# Patient Record
Sex: Female | Born: 1970 | Race: Black or African American | Hispanic: No | Marital: Married | Smoking: Never smoker
Health system: Southern US, Community
[De-identification: ages and names within clinical notes are randomized; demographics above are authoritative.]

## PROBLEM LIST (undated history)

## (undated) DIAGNOSIS — K219 Gastro-esophageal reflux disease without esophagitis: Secondary | ICD-10-CM

## (undated) DIAGNOSIS — G8929 Other chronic pain: Secondary | ICD-10-CM

## (undated) HISTORY — PX: NO PAST SURGERIES: SHX2092

## (undated) HISTORY — DX: Other chronic pain: G89.29

---

## 2020-10-30 ENCOUNTER — Other Ambulatory Visit: Payer: Self-pay

## 2020-10-30 ENCOUNTER — Ambulatory Visit (HOSPITAL_COMMUNITY)
Admission: EM | Admit: 2020-10-30 | Discharge: 2020-10-30 | Disposition: A | Discharge status: Home / Self Care | Payer: Medicaid Other

## 2020-10-30 ENCOUNTER — Encounter (HOSPITAL_COMMUNITY): Payer: Self-pay

## 2020-10-30 DIAGNOSIS — K2950 Unspecified chronic gastritis without bleeding: Secondary | ICD-10-CM

## 2020-10-30 NOTE — ED Provider Notes (Signed)
[facility]    CSN: [id number] Arrival date & time: 10/30/20  1115      History   Chief Complaint Chief Complaint  Patient presents with   Abdominal Pain    HPI Stacy Conner is a 50 y.o. female.   StatePatient states she immigrated to [place] from [place] 5 days ago.  Patient reports history of abdominal issues since 2004.  She has intermittent episodes of constipation and diarrhea.  When having episodes of diarrhea, she experiences bloating abdominal cramping at various locations throughout her abdomen.  Patient states that despite alternating episodes of constipation and diarrhea she also chronically has epigastric pain that is reproducible with deep palpation.  Patient states she is able to eat but has pain when eating.  Patient denies any recent weight loss.  Patient denies nausea, vomiting, blood in stool, mucus in stool, fever, aches, chills.  States she is not currently taking any medication for this, states she has been prescribed medication by provider in [place] however states it did not help, does not know what medication was called.   Abdominal Pain Associated symptoms: constipation and diarrhea   Associated symptoms: no nausea and no vomiting    History reviewed. No pertinent past medical history.  There are no problems to display for this patient.   History reviewed. No pertinent surgical history.  OB History   No obstetric history on file.      Home Medications    Prior to Admission medications   Not on File    Family History Family History  Family history unknown: Yes    Social History Social History   Tobacco Use   Smoking status: Never   Smokeless tobacco: Never  Vaping Use   Vaping Use: Never used  Substance Use Topics   Alcohol use: Never   Drug use: Never     Allergies   Patient has no known allergies.   Review of Systems Review of Systems  Gastrointestinal:  Positive for abdominal pain, constipation  and diarrhea. Negative for abdominal distention, anal bleeding, blood in stool, nausea, rectal pain and vomiting.    Physical Exam Triage Vital Signs ED Triage Vitals  Enc Vitals Group     BP 10/30/20 1246 135/82     Pulse Rate 10/30/20 1246 97     Resp 10/30/20 1246 18     Temp 10/30/20 1246 98.6 F (37 C)     Temp Source 10/30/20 1246 Oral     SpO2 10/30/20 1246 100 %     Weight --      Height --      Head Circumference --      Peak Flow --      Pain Score 10/30/20 1240 5     Pain Loc --      Pain Edu? --      Excl. in GC? --    No data found.  Updated Vital Signs BP 135/82 (BP Location: Left Arm)   Pulse 97   Temp 98.6 F (37 C) (Oral)   Resp 18   LMP 10/25/2020 (Exact Date)   SpO2 100%   Visual Acuity Right Eye Distance:   Left Eye Distance:   Bilateral Distance:    Right Eye Near:   Left Eye Near:    Bilateral Near:     Physical Exam Constitutional:      Appearance: She is well-developed.  HENT:     Head: Normocephalic and atraumatic.  Cardiovascular:  Rate and Rhythm: Normal rate and regular rhythm.     Heart sounds: Normal heart sounds.  Pulmonary:     Effort: Pulmonary effort is normal.     Breath sounds: Normal breath sounds.  Abdominal:     General: Abdomen is flat. Bowel sounds are normal. There is no distension or abdominal bruit. There are no signs of injury.     Palpations: Abdomen is soft. There is no hepatomegaly, splenomegaly or mass.     Tenderness: There is abdominal tenderness in the epigastric area. There is no right CVA tenderness, left CVA tenderness, guarding or rebound.     Hernia: No hernia is present.  Skin:    General: Skin is warm and dry.  Neurological:     General: No focal deficit present.     Mental Status: She is alert and oriented to person, place, and time.  Psychiatric:        Mood and Affect: Mood normal.        Behavior: Behavior normal.     UC Treatments / Results  Labs (all labs ordered are listed, but  only abnormal results are displayed) Labs Reviewed - No data to display  EKG   Radiology No results found.  Procedures Procedures (including critical care time)  Medications Ordered in UC Medications - No data to display  Initial Impression / Assessment and Plan / UC Course  I have reviewed the triage vital signs and the nursing notes.  Pertinent labs & imaging results that were available during my care of the patient were reviewed by me and considered in my medical decision making (see chart for details).     Differential diagnoses include irritable bowel syndrome and gastritis, possible H. pylori infection given nationality and duration of symptoms.  At this time, patient states she is not having any pain and is nontoxic in appearance.  I am disinclined to prescribe a PPI for patient at this time as this will interfere with H. pylori breath testing.  I have strongly encourage patient to follow-up with primary care to discuss her symptoms and to request testing for H. pylori.  Plan discussed with patient who verbalized understanding.  All questions were addressed. Final Clinical Impressions(s) / UC Diagnoses   Final diagnoses:  Chronic gastritis without bleeding, unspecified gastritis type     Discharge Instructions      Because you are not in any acute pain during your visit today, I recommend that you obtain a primary care physician and follow-up with them regarding her constellation of abdominal symptoms.  I have sent a message that you require assistance primary care physician.  Someone should reach out to you within the next 2 to 3 days.     ED Prescriptions   None    PDMP not reviewed this encounter.   Theadora Rama Scales, PA-C 10/30/20 1351

## 2020-10-30 NOTE — Discharge Instructions (Addendum)
Because you are not in any acute pain during your visit today, I recommend that you obtain a primary care physician and follow-up with them regarding her constellation of abdominal symptoms.  I have sent a message that you require assistance primary care physician.  Someone should reach out to you within the next 2 to 3 days.

## 2020-10-30 NOTE — ED Triage Notes (Signed)
Pt in with c/ abdominal pain x 6 months  Pt has has not taken any medication for sxs

## 2020-11-29 ENCOUNTER — Telehealth: Payer: Self-pay

## 2020-11-29 NOTE — Telephone Encounter (Signed)
Patient is requesting transportation assistance. Ride scheduled with [employer] Pick Up time 1:00 PM.  Arman Bogus RN BSn PCCN  [facility] [phone]-cell [phone]-office

## 2020-11-30 ENCOUNTER — Other Ambulatory Visit: Payer: Self-pay

## 2020-11-30 ENCOUNTER — Encounter: Payer: Self-pay | Admitting: Nurse Practitioner

## 2020-11-30 ENCOUNTER — Ambulatory Visit (INDEPENDENT_AMBULATORY_CARE_PROVIDER_SITE_OTHER): Payer: Self-pay | Admitting: Nurse Practitioner

## 2020-11-30 VITALS — BP 126/77 | HR 93 | Temp 97.6°F | Ht 66.0 in | Wt 159.6 lb

## 2020-11-30 DIAGNOSIS — Z0000 Encounter for general adult medical examination without abnormal findings: Secondary | ICD-10-CM

## 2020-11-30 DIAGNOSIS — Z87898 Personal history of other specified conditions: Secondary | ICD-10-CM

## 2020-11-30 DIAGNOSIS — R002 Palpitations: Secondary | ICD-10-CM

## 2020-11-30 NOTE — Progress Notes (Signed)
[place], [place]  [address] Phone:  [phone]   Fax:  [phone] Subjective:   Patient ID: Stacy Conner, female    DOB: 11/25/1970, 50 y.o.   MRN: [id number]  Chief Complaint  Patient presents with   Establish Care    Pt is here to establish care. Pt has been having back pains x 2 years.  Pt states that she has been having respiratory issues due to the weather changing.  Pt states that she has been having racing heart beats x 1 month.     HPI [name] 50 y.o. female with no significant medical history to the [facility] to establish care. Patient a refugee from [place] and has been in the [place] for 1 mth. Currently resides with husband and kids. Does not currently adhere to any diet and/ exercise regimen. Not currently employed.  Requests evaluation for palpitations and shortness of breath that has been occurring intermittently for the past month. Denies any worsening or improving factors. Has not been previously evaluated for symptoms and has not taken any medications for symptoms. Symptoms occur periodically throughout the day.  Endorses a history of intermittent chest pain and shortness of breath. Chest pain located in the left anterior chest. Has had symptoms x 2 yrs. Was evaluated by doctor prior to arrival to the [place] and was informed it was related to changes in weather. Describes pain as sharp . Endorses some dizziness that occurs with symptoms.Denies any worsening or improving factors. Has not taken any medications for symptoms. When she was previously evaluated, had an ekg and ultrasound completed with normal results. Concerned because she continues to have pain. Currently unable to rate pain, describes as mild. Denies any possible anxiety or new stressors that may be contributing to symptoms  Verbalizes also having some GI concerns that are chronic in nature, 6 mths. Denies any other complaints during visit.  Denies any fever. Denies any fatigue or HA. Denies any blurred vision, numbness or tingling.    History reviewed. No pertinent past medical history.  History reviewed. No pertinent surgical history.  Family History  Family history unknown: Yes    Social History   Socioeconomic History   Marital status: Married    Spouse name: Not on file   Number of children: Not on file   Years of education: Not on file   Highest education level: Not on file  Occupational History   Not on file  Tobacco Use   Smoking status: Never   Smokeless tobacco: Never  Vaping Use   Vaping Use: Never used  Substance and Sexual Activity   Alcohol use: Never   Drug use: Never   Sexual activity: Yes    Birth control/protection: None  Other Topics Concern   Not on file  Social History Narrative   Not on file   Social Determinants of Health   Financial Resource Strain: Not on file  Food Insecurity: Not on file  Transportation Needs: Not on file  Physical Activity: Not on file  Stress: Not on file  Social Connections: Not on file  Intimate Partner Violence: Not on file    No outpatient medications prior to visit.   No facility-administered medications prior to visit.    No Known Allergies  Review of Systems  Constitutional:  Negative for chills, fever and malaise/fatigue.  HENT: Negative.    Eyes: Negative.   Respiratory:  Positive for shortness of breath. Negative  for cough.   Cardiovascular:  Positive for chest pain and palpitations. Negative for leg swelling.  Gastrointestinal:  Negative for abdominal pain, blood in stool, constipation, diarrhea, nausea and vomiting.  Genitourinary: Negative.   Musculoskeletal: Negative.   Skin: Negative.   Neurological:  Positive for dizziness. Negative for tingling, tremors, sensory change, speech change, focal weakness, loss of consciousness and headaches.  Psychiatric/Behavioral:  Negative for depression. The patient is not nervous/anxious.    All other systems reviewed and are negative.     Objective:    Physical Exam Vitals reviewed.  Constitutional:      General: She is not in acute distress.    Appearance: Normal appearance.  HENT:     Head: Normocephalic.     Right Ear: Tympanic membrane, ear canal and external ear normal.     Left Ear: Tympanic membrane, ear canal and external ear normal.     Nose: Nose normal.     Mouth/Throat:     Mouth: Mucous membranes are moist.     Pharynx: Oropharynx is clear.  Eyes:     Extraocular Movements: Extraocular movements intact.     Conjunctiva/sclera: Conjunctivae normal.     Pupils: Pupils are equal, round, and reactive to light.  Cardiovascular:     Rate and Rhythm: Normal rate and regular rhythm.     Pulses: Normal pulses.     Heart sounds: Normal heart sounds.     Comments: No obvious peripheral edema. Diffuse chest is non tender to palpation Pulmonary:     Effort: Pulmonary effort is normal.     Breath sounds: Normal breath sounds.  Abdominal:     General: Abdomen is flat. Bowel sounds are normal.     Palpations: Abdomen is soft.     Tenderness: There is no abdominal tenderness. There is no right CVA tenderness or left CVA tenderness.  Musculoskeletal:        General: Normal range of motion.     Cervical back: Normal range of motion and neck supple.  Skin:    General: Skin is warm and dry.     Capillary Refill: Capillary refill takes less than 2 seconds.  Neurological:     Mental Status: She is alert.  Psychiatric:        Mood and Affect: Mood normal.        Behavior: Behavior normal.        Thought Content: Thought content normal.        Judgment: Judgment normal.    BP 126/77   Pulse 93   Temp 97.6 F (36.4 C)   Ht 5' 6"  (1.676 m)   Wt 159 lb 9.6 oz (72.4 kg)   SpO2 100%   BMI 25.76 kg/m  Wt Readings from Last 3 Encounters:  11/30/20 159 lb 9.6 oz (72.4 kg)     There is no immunization history on file for this patient.  Diabetic Foot Exam -  Simple   No data filed     No results found for: TSH No results found for: WBC, HGB, HCT, MCV, PLT No results found for: NA, K, CHLORIDE, CO2, GLUCOSE, BUN, CREATININE, BILITOT, ALKPHOS, AST, ALT, PROT, ALBUMIN, CALCIUM, ANIONGAP, EGFR, GFR No results found for: CHOL No results found for: HDL No results found for: LDLCALC No results found for: TRIG No results found for: CHOLHDL No results found for: HGBA1C     Assessment & Plan:   Problem List Items Addressed This Visit   None Visit Diagnoses  Healthcare maintenance    -  Primary   Relevant Orders   CBC with Differential/Platelet   Comprehensive metabolic panel   Lipid panel   POCT glycosylated hemoglobin (Hb A1C)   Vitamin D, 25-hydroxy   Vitamin B12   HIV antibody (with reflex) Encouraged continued diet and exercise efforts  Encouraged continued compliance with medication     Palpitations       Relevant Orders   CBC with Differential/Platelet   Comprehensive metabolic panel   Lipid panel   POCT glycosylated hemoglobin (Hb A1C)   Vitamin D, 25-hydroxy   Vitamin B12   EKG 12-Lead: EKG: normal EKG, normal sinus rhythm.    History of chest pain     Patient informed to take OTC medications as needed for symptoms Will reevaluate need for prescription medications after review of completed studies. Patient may also require referral to cardiology.    Follow up in 2 wks for further evaluation of GI symptoms, sooner as needed    Brett Albino does not currently have medications on file.  No orders of the defined types were placed in this encounter.    Teena Dunk, NP

## 2020-11-30 NOTE — Patient Instructions (Signed)
You were seen today in the [facility] establish care. Labs were collected, results will be available via MyChart or, if abnormal, you will be contacted by clinic staff.  Please follow up in 2 wks for evaluation of GI symptoms.

## 2020-12-01 LAB — CBC WITH DIFFERENTIAL/PLATELET
Basophils Absolute: 0 10*3/uL (ref 0.0–0.2)
Basos: 1 %
EOS (ABSOLUTE): 0.7 10*3/uL — ABNORMAL HIGH (ref 0.0–0.4)
Eos: 11 %
Hematocrit: 39.1 % (ref 34.0–46.6)
Hemoglobin: 13.1 g/dL (ref 11.1–15.9)
Immature Grans (Abs): 0 10*3/uL (ref 0.0–0.1)
Immature Granulocytes: 0 %
Lymphocytes Absolute: 2.5 10*3/uL (ref 0.7–3.1)
Lymphs: 40 %
MCH: 29.4 pg (ref 26.6–33.0)
MCHC: 33.5 g/dL (ref 31.5–35.7)
MCV: 88 fL (ref 79–97)
Monocytes Absolute: 0.7 10*3/uL (ref 0.1–0.9)
Monocytes: 11 %
Neutrophils Absolute: 2.3 10*3/uL (ref 1.4–7.0)
Neutrophils: 37 %
Platelets: 285 10*3/uL (ref 150–450)
RBC: 4.46 x10E6/uL (ref 3.77–5.28)
RDW: 12.1 % (ref 11.7–15.4)
WBC: 6.3 10*3/uL (ref 3.4–10.8)

## 2020-12-01 LAB — VITAMIN B12: Vitamin B-12: 798 pg/mL (ref 232–1245)

## 2020-12-01 LAB — COMPREHENSIVE METABOLIC PANEL
ALT: 12 IU/L (ref 0–32)
AST: 15 IU/L (ref 0–40)
Albumin/Globulin Ratio: 1.4 (ref 1.2–2.2)
Albumin: 4.3 g/dL (ref 3.8–4.8)
Alkaline Phosphatase: 71 IU/L (ref 44–121)
BUN/Creatinine Ratio: 22 (ref 9–23)
BUN: 16 mg/dL (ref 6–24)
Bilirubin Total: 0.3 mg/dL (ref 0.0–1.2)
CO2: 24 mmol/L (ref 20–29)
Calcium: 9.3 mg/dL (ref 8.7–10.2)
Chloride: 100 mmol/L (ref 96–106)
Creatinine, Ser: 0.74 mg/dL (ref 0.57–1.00)
Globulin, Total: 3.1 g/dL (ref 1.5–4.5)
Glucose: 93 mg/dL (ref 70–99)
Potassium: 3.9 mmol/L (ref 3.5–5.2)
Sodium: 138 mmol/L (ref 134–144)
Total Protein: 7.4 g/dL (ref 6.0–8.5)
eGFR: 100 mL/min/{1.73_m2} (ref >59)

## 2020-12-01 LAB — LIPID PANEL
Chol/HDL Ratio: 3.3 ratio (ref 0.0–4.4)
Cholesterol, Total: 190 mg/dL (ref 100–199)
HDL: 58 mg/dL (ref >39)
LDL Chol Calc (NIH): 121 mg/dL — ABNORMAL HIGH (ref 0–99)
Triglycerides: 56 mg/dL (ref 0–149)
VLDL Cholesterol Cal: 11 mg/dL (ref 5–40)

## 2020-12-01 LAB — VITAMIN D 25 HYDROXY (VIT D DEFICIENCY, FRACTURES): Vit D, 25-Hydroxy: 13.3 ng/mL — ABNORMAL LOW (ref 30.0–100.0)

## 2020-12-01 LAB — HIV ANTIBODY (ROUTINE TESTING W REFLEX): HIV Screen 4th Generation wRfx: NONREACTIVE

## 2020-12-03 ENCOUNTER — Other Ambulatory Visit: Payer: Self-pay | Admitting: Nurse Practitioner

## 2020-12-03 DIAGNOSIS — E559 Vitamin D deficiency, unspecified: Secondary | ICD-10-CM

## 2020-12-03 MED ORDER — VITAMIN D (ERGOCALCIFEROL) 50000 UNITS PO CAPS: 1.0000 | ORAL_CAPSULE | ORAL | 0 refills | Status: DC

## 2020-12-11 ENCOUNTER — Ambulatory Visit: Payer: Self-pay | Admitting: Nurse Practitioner

## 2020-12-13 ENCOUNTER — Telehealth: Payer: Self-pay

## 2020-12-13 NOTE — Telephone Encounter (Signed)
Transportation scheduled for October 31st. Pick up time 1:45pm Patient made aware.  Nicole Cella Trent Theisen RN BSn PCCN  [facility] [phone]-cell [phone]-office

## 2020-12-17 ENCOUNTER — Ambulatory Visit: Payer: Medicaid Other | Admitting: Nurse Practitioner

## 2020-12-21 ENCOUNTER — Encounter: Payer: Self-pay | Admitting: Nurse Practitioner

## 2020-12-21 ENCOUNTER — Other Ambulatory Visit: Payer: Self-pay

## 2020-12-21 ENCOUNTER — Ambulatory Visit (INDEPENDENT_AMBULATORY_CARE_PROVIDER_SITE_OTHER): Payer: Medicaid Other | Admitting: Nurse Practitioner

## 2020-12-21 VITALS — BP 114/85 | HR 83 | Temp 98.1°F | Ht 66.0 in | Wt 156.0 lb

## 2020-12-21 DIAGNOSIS — R1084 Generalized abdominal pain: Secondary | ICD-10-CM | POA: Diagnosis not present

## 2020-12-21 DIAGNOSIS — E559 Vitamin D deficiency, unspecified: Secondary | ICD-10-CM | POA: Diagnosis not present

## 2020-12-21 DIAGNOSIS — G4489 Other headache syndrome: Secondary | ICD-10-CM | POA: Diagnosis not present

## 2020-12-21 DIAGNOSIS — Z0000 Encounter for general adult medical examination without abnormal findings: Secondary | ICD-10-CM

## 2020-12-21 LAB — POCT GLYCOSYLATED HEMOGLOBIN (HGB A1C): Hemoglobin A1C: 5.5 % (ref 4.0–5.6)

## 2020-12-21 MED ORDER — VITAMIN D (ERGOCALCIFEROL) 50000 UNITS PO CAPS: 1.0000 | ORAL_CAPSULE | ORAL | 0 refills | Status: DC

## 2020-12-21 MED ORDER — BUTALBITAL-APAP-CAFFEINE 50-325-40 MG PO TABS: 1.0000 | ORAL_TABLET | Freq: Four times a day (QID) | ORAL | 0 refills | Status: DC | PRN

## 2020-12-21 MED ORDER — ONDANSETRON HCL 4 MG PO TABS: 4.0000 mg | ORAL_TABLET | Freq: Three times a day (TID) | ORAL | 0 refills | Status: DC | PRN

## 2020-12-21 MED ORDER — DICYCLOMINE HCL 10 MG PO CAPS: 10.0000 mg | ORAL_CAPSULE | Freq: Three times a day (TID) | ORAL | 0 refills | Status: DC | PRN

## 2020-12-21 NOTE — Patient Instructions (Signed)
You were seen today in the [facility] for abdominal pain and headache. Labs were collected, results will be available via MyChart or, if abnormal, you will be contacted by clinic staff. You were prescribed medications, please take as directed. Please follow up in 1-2 wks for reevaluation, we will call with your follow up appointment after review of completed studies.

## 2020-12-21 NOTE — Progress Notes (Signed)
[place], [place]  [address] Phone:  [phone]   Fax:  [phone] Subjective:   Patient ID: Stacy Conner, female    DOB: 03/28/1970, 50 y.o.   MRN: [id number]  Chief Complaint  Patient presents with   Follow-up    2 week follow up; continues to have stomach ache. Unable to eat or sleep, causing headaches.    HPI Stacy Conner 50 y.o. female with no significant medical history to the [facility] for further evaluation of abdominal pain. Patient states that she has had abdominal pain since 2000, that worsened since transitioning to the [place]. Pain located in the mid upper abdomen. Has never been evaluated and/ treated for pain. Denies any known worsening or improving factors. Has taken pain medications for pain with no relief. Endorses nausea with vomiting, especially after eating certain foods, such as foods that taste sour. Able to eat soup without any symptoms. Verbalizes having bloating and diarrhea. When she vomits, it is bitter tasting and yellow in color.Currently rates pain 10/10 and describes as stabbing. Last bowel movement  Also concerned about HA x 3 days. HA located in the frontal portion of head and bilateral temples. Has had some blurry vision and ringing in ears with headaches. Has not taken any medications for symptoms. Had similar HA 2 wks ago that resolved without intervention after 3 days. Currently rates pain 10/10 and describes as throbbing.   States that with abdominal pain and HA has been unable to sleep for several days. Denies any other complaints today. Denies any fever. Denies any fatigue, chest pain, shortness of breath or dizziness. Denies any numbness or tingling.   No past medical history on file.  No past surgical history on file.  Family History  Family history unknown: Yes    Social History   Socioeconomic History   Marital status: Married    Spouse name: Not on file   Number of  children: Not on file   Years of education: Not on file   Highest education level: Not on file  Occupational History   Not on file  Tobacco Use   Smoking status: Never   Smokeless tobacco: Never  Vaping Use   Vaping Use: Never used  Substance and Sexual Activity   Alcohol use: Never   Drug use: Never   Sexual activity: Yes    Birth control/protection: None  Other Topics Concern   Not on file  Social History Narrative   Not on file   Social Determinants of Health   Financial Resource Strain: Not on file  Food Insecurity: Not on file  Transportation Needs: Not on file  Physical Activity: Not on file  Stress: Not on file  Social Connections: Not on file  Intimate Partner Violence: Not on file    Outpatient Medications Prior to Visit  Medication Sig Dispense Refill   Vitamin D, Ergocalciferol, 50000 units CAPS Take 1 tablet by mouth once a week for 8 doses. (Patient not taking: Reported on 12/21/2020) 8 capsule 0   No facility-administered medications prior to visit.    No Known Allergies  Review of Systems  Constitutional:  Negative for chills, fever and malaise/fatigue.  HENT: Negative.    Eyes:  Positive for blurred vision.  Respiratory:  Negative for cough and shortness of breath.   Cardiovascular:  Negative for chest pain, palpitations and leg swelling.  Gastrointestinal:  Positive for abdominal pain, diarrhea, nausea and vomiting. Negative  for blood in stool and constipation.  Genitourinary: Negative.   Musculoskeletal: Negative.   Skin: Negative.   Neurological:  Positive for headaches. Negative for dizziness, tingling, tremors, seizures, loss of consciousness and weakness.  Psychiatric/Behavioral:  Negative for depression. The patient is not nervous/anxious.   All other systems reviewed and are negative.     Objective:    Physical Exam Vitals reviewed.  Constitutional:      General: She is not in acute distress.    Appearance: Normal appearance.  HENT:      Head: Normocephalic.     Right Ear: Tympanic membrane, ear canal and external ear normal.     Left Ear: Tympanic membrane and external ear normal.     Nose: Nose normal.     Mouth/Throat:     Mouth: Mucous membranes are moist.     Pharynx: Oropharynx is clear.  Eyes:     Extraocular Movements: Extraocular movements intact.     Conjunctiva/sclera: Conjunctivae normal.     Pupils: Pupils are equal, round, and reactive to light.  Cardiovascular:     Rate and Rhythm: Normal rate and regular rhythm.     Pulses: Normal pulses.     Heart sounds: Normal heart sounds.     Comments: No obvious peripheral edema Pulmonary:     Effort: Pulmonary effort is normal.     Breath sounds: Normal breath sounds.  Abdominal:     General: Abdomen is flat. Bowel sounds are normal. There is no distension.     Palpations: Abdomen is soft. There is no mass.     Tenderness: There is no abdominal tenderness. There is no right CVA tenderness, left CVA tenderness or rebound.  Musculoskeletal:     Cervical back: Normal range of motion and neck supple.  Skin:    General: Skin is warm and dry.     Capillary Refill: Capillary refill takes less than 2 seconds.  Neurological:     General: No focal deficit present.     Mental Status: She is alert and oriented to person, place, and time.  Psychiatric:        Mood and Affect: Mood normal.        Behavior: Behavior normal.        Thought Content: Thought content normal.        Judgment: Judgment normal.    BP 114/85 (BP Location: Right Arm, Patient Position: Sitting)   Pulse 83   Temp 98.1 F (36.7 C)   Wt 156 lb 0.7 oz (70.8 kg)   LMP 12/14/2020   SpO2 100%   BMI 25.19 kg/m  Wt Readings from Last 3 Encounters:  12/21/20 156 lb 0.7 oz (70.8 kg)  11/30/20 159 lb 9.6 oz (72.4 kg)     There is no immunization history on file for this patient.  Diabetic Foot Exam - Simple   No data filed     No results found for: TSH Lab Results  Component Value  Date   WBC 6.3 11/30/2020   HGB 13.1 11/30/2020   HCT 39.1 11/30/2020   MCV 88 11/30/2020   PLT 285 11/30/2020   Lab Results  Component Value Date   NA 138 11/30/2020   K 3.9 11/30/2020   CO2 24 11/30/2020   GLUCOSE 93 11/30/2020   BUN 16 11/30/2020   CREATININE 0.74 11/30/2020   BILITOT 0.3 11/30/2020   ALKPHOS 71 11/30/2020   AST 15 11/30/2020   ALT 12 11/30/2020   PROT 7.4 11/30/2020  ALBUMIN 4.3 11/30/2020   CALCIUM 9.3 11/30/2020   EGFR 100 11/30/2020   Lab Results  Component Value Date   CHOL 190 11/30/2020   Lab Results  Component Value Date   HDL 58 11/30/2020   Lab Results  Component Value Date   LDLCALC 121 (H) 11/30/2020   Lab Results  Component Value Date   TRIG 56 11/30/2020   Lab Results  Component Value Date   CHOLHDL 3.3 11/30/2020   No results found for: HGBA1C     Assessment & Plan:  Given patients history and physical, concerned for gastroenteritis v enteritis v gastritis v diverticulitis v cholelithiasis v cholecystitis v GERD   With regard to HA concerned for new onset migraines   Orders completed as below:  Problem List Items Addressed This Visit   None Visit Diagnoses     Generalized abdominal pain    -  Primary   Relevant Medications   dicyclomine (BENTYL) 10 MG capsule   ondansetron (ZOFRAN) 4 MG tablet Discussed non pharmacological methods for managing pain   Other Relevant Orders   Lipase   POCT glycosylated hemoglobin (Hb A1C)   CT Abdomen Pelvis W Contrast   Healthcare maintenance       Relevant Orders   POCT glycosylated hemoglobin (Hb A1C)   Other headache syndrome       Relevant Medications   butalbital-acetaminophen-caffeine (FIORICET) 50-325-40 MG tablet Discussed non pharmacological methods for managing pain   Vitamin D deficiency       Relevant Medications   Vitamin D, Ergocalciferol, 50000 units CAPS   Will notify patient of follow up after reviewing studies, may require referral to GI with subsequent  colonoscopy    Stacy Conner start on butalbital-acetaminophen-caffeine, dicyclomine, and ondansetron. Stacy am also having her maintain her Vitamin D (Ergocalciferol).  Meds ordered this encounter  Medications   butalbital-acetaminophen-caffeine (FIORICET) 50-325-40 MG tablet    Sig: Take 1 tablet by mouth every 6 (six) hours as needed for headache.    Dispense:  14 tablet    Refill:  0   dicyclomine (BENTYL) 10 MG capsule    Sig: Take 1 capsule (10 mg total) by mouth 3 (three) times daily as needed (abdominal pain).    Dispense:  20 capsule    Refill:  0   ondansetron (ZOFRAN) 4 MG tablet    Sig: Take 1 tablet (4 mg total) by mouth every 8 (eight) hours as needed for nausea or vomiting.    Dispense:  20 tablet    Refill:  0   Vitamin D, Ergocalciferol, 50000 units CAPS    Sig: Take 1 tablet by mouth once a week for 8 doses.    Dispense:  8 capsule    Refill:  0     Teena Dunk, NP

## 2020-12-22 LAB — LIPASE: Lipase: 26 U/L (ref 14–72)

## 2021-01-15 ENCOUNTER — Other Ambulatory Visit: Payer: Self-pay

## 2021-01-15 ENCOUNTER — Ambulatory Visit (HOSPITAL_COMMUNITY)
Admission: EM | Admit: 2021-01-15 | Discharge: 2021-01-15 | Disposition: A | Discharge status: Home / Self Care | Payer: Medicaid Other | Attending: Emergency Medicine | Admitting: Emergency Medicine

## 2021-01-15 ENCOUNTER — Encounter (HOSPITAL_COMMUNITY): Payer: Self-pay

## 2021-01-15 DIAGNOSIS — R112 Nausea with vomiting, unspecified: Secondary | ICD-10-CM | POA: Diagnosis present

## 2021-01-15 DIAGNOSIS — K2950 Unspecified chronic gastritis without bleeding: Secondary | ICD-10-CM | POA: Diagnosis present

## 2021-01-15 DIAGNOSIS — R1084 Generalized abdominal pain: Secondary | ICD-10-CM | POA: Diagnosis present

## 2021-01-15 LAB — POCT URINALYSIS DIPSTICK, ED / UC
Bilirubin Urine: NEGATIVE
Glucose, UA: NEGATIVE mg/dL
Nitrite: NEGATIVE
Protein, ur: NEGATIVE mg/dL
Specific Gravity, Urine: 1.02 (ref 1.005–1.030)
Urobilinogen, UA: 0.2 mg/dL (ref 0.0–1.0)
pH: 6.5 (ref 5.0–8.0)

## 2021-01-15 MED ORDER — ONDANSETRON HCL 4 MG PO TABS: 4.0000 mg | ORAL_TABLET | Freq: Three times a day (TID) | ORAL | 0 refills | Status: DC | PRN

## 2021-01-15 MED ORDER — ONDANSETRON 4 MG PO TBDP
4.0000 mg | ORAL_TABLET | Freq: Once | ORAL | Status: AC
  Administered 2021-01-15: 4 mg via ORAL

## 2021-01-15 MED ORDER — PANTOPRAZOLE SODIUM 40 MG PO TBEC: 40.0000 mg | DELAYED_RELEASE_TABLET | Freq: Every day | ORAL | 0 refills | Status: DC

## 2021-01-15 MED ORDER — ALUM & MAG HYDROXIDE-SIMETH 200-200-20 MG/5ML PO SUSP
30.0000 mL | Freq: Once | ORAL | Status: AC
  Administered 2021-01-15: 30 mL via ORAL

## 2021-01-15 NOTE — ED Provider Notes (Signed)
[facility]    CSN: [id number] Arrival date & time: 01/15/21  1303      History   Chief Complaint Chief Complaint  Patient presents with   Abdominal Pain    HPI Stacy Conner is a 50 y.o. female. She reports multiple long-term abdominal complaints. Reports 3 days of "Burning" abdominal pain in B sides and epigastric area although entire abd hurts. Associated with vomiting every time she eats. Has not modified her diet since pain and vomiting began. Emesis is yellow and "bitter".  Reports similar symptoms previously when she was living in [place]; she was treated for "stomach bacteria" with antibiotics; she was told the infection could recur. She thinks she has the same infection again. Reports recent occasional dysuria. Also has a headache today. Has never had surgery.   Recently established care at the [facility] and was prescribed dicyclomine and ondanestron in early November but pt stopped taking them a week ago because she felt they were making her feel worse.   Also complains of "colon problems" and requests "stool testing".    This visit was conducted with the help of an arabic interpreter by telephone.    Abdominal Pain Associated symptoms: constipation, dysuria, nausea and vomiting   Associated symptoms: no chills, no diarrhea and no fever    History reviewed. No pertinent past medical history.  There are no problems to display for this patient.   History reviewed. No pertinent surgical history.  OB History   No obstetric history on file.      Home Medications    Prior to Admission medications   Medication Sig Start Date End Date Taking? Authorizing Provider  pantoprazole (PROTONIX) 40 MG tablet Take 1 tablet (40 mg total) by mouth daily. 01/15/21  Yes Cathlyn Parsons, NP  butalbital-acetaminophen-caffeine (FIORICET) 50-325-40 MG tablet Take 1 tablet by mouth every 6 (six) hours as needed for headache. 12/21/20    Passmore, Enid Derry I, NP  dicyclomine (BENTYL) 10 MG capsule Take 1 capsule (10 mg total) by mouth 3 (three) times daily as needed (abdominal pain). 12/21/20   Passmore, Enid Derry I, NP  ondansetron (ZOFRAN) 4 MG tablet Take 1 tablet (4 mg total) by mouth every 8 (eight) hours as needed for nausea or vomiting. 01/15/21   Cathlyn Parsons, NP  Vitamin D, Ergocalciferol, 50000 units CAPS Take 1 tablet by mouth once a week for 8 doses. 12/21/20 02/09/21  Orion Crook I, NP    Family History Family History  Family history unknown: Yes    Social History Social History   Tobacco Use   Smoking status: Never   Smokeless tobacco: Never  Vaping Use   Vaping Use: Never used  Substance Use Topics   Alcohol use: Never   Drug use: Never     Allergies   Patient has no known allergies.   Review of Systems Review of Systems  Constitutional:  Negative for appetite change, chills and fever.  Gastrointestinal:  Positive for abdominal pain, constipation, nausea and vomiting. Negative for diarrhea.  Genitourinary:  Positive for dysuria.    Physical Exam Triage Vital Signs ED Triage Vitals  Enc Vitals Group     BP 01/15/21 1425 128/81     Pulse Rate 01/15/21 1425 93     Resp 01/15/21 1425 18     Temp 01/15/21 1425 98.6 F (37 C)     Temp Source 01/15/21 1425 Oral     SpO2 01/15/21 1425 100 %  Weight --      Height --      Head Circumference --      Peak Flow --      Pain Score 01/15/21 1431 8     Pain Loc --      Pain Edu? --      Excl. in GC? --    No data found.  Updated Vital Signs BP 128/81 (BP Location: Left Arm)   Pulse 93   Temp 98.6 F (37 C) (Oral)   Resp 18   LMP 01/10/2021 (Exact Date)   SpO2 100%   Visual Acuity Right Eye Distance:   Left Eye Distance:   Bilateral Distance:    Right Eye Near:   Left Eye Near:    Bilateral Near:     Physical Exam Constitutional:      Appearance: She is well-developed.     Comments: Appears uncomfortable   Cardiovascular:     Rate and Rhythm: Normal rate and regular rhythm.  Pulmonary:     Effort: Pulmonary effort is normal.     Breath sounds: Normal breath sounds.  Abdominal:     General: Abdomen is flat. Bowel sounds are normal. There is no distension.     Palpations: Abdomen is soft.     Tenderness: There is generalized abdominal tenderness. There is no right CVA tenderness, left CVA tenderness, guarding or rebound.     Comments: Pain is worst in epigastric area  Neurological:     Mental Status: She is alert.     UC Treatments / Results  Labs (all labs ordered are listed, but only abnormal results are displayed) Labs Reviewed  POCT URINALYSIS DIPSTICK, ED / UC - Abnormal; Notable for the following components:      Result Value   Ketones, ur TRACE (*)    Hgb urine dipstick SMALL (*)    Leukocytes,Ua TRACE (*)    All other components within normal limits  H. PYLORI ANTIBODY, IGG    EKG   Radiology No results found.  Procedures Procedures (including critical care time)  Medications Ordered in UC Medications  alum & mag hydroxide-simeth (MAALOX/MYLANTA) 200-200-20 MG/5ML suspension 30 mL (30 mLs Oral Given 01/15/21 1535)  ondansetron (ZOFRAN-ODT) disintegrating tablet 4 mg (4 mg Oral Given 01/15/21 1516)    Initial Impression / Assessment and Plan / UC Course  I have reviewed the triage vital signs and the nursing notes.  Pertinent labs & imaging results that were available during my care of the patient were reviewed by me and considered in my medical decision making (see chart for details).     Nausea improved with zofran ODT; abdominal pain improved with Maalox.  Pt to f/u with pcp for further eval of recurrent, chronic abdominal pain. Rx zofran prn and protonix to be taken daily. H.Pylori test obtained - I verified pt's phone number in epic is correct - she requires [place] Arabic interpreter.     Final Clinical Impressions(s) / UC Diagnoses   Final  diagnoses:  Chronic gastritis without bleeding, unspecified gastritis type  Nausea and vomiting, unspecified vomiting type   Discharge Instructions   None    ED Prescriptions     Medication Sig Dispense Auth. Provider   ondansetron (ZOFRAN) 4 MG tablet Take 1 tablet (4 mg total) by mouth every 8 (eight) hours as needed for nausea or vomiting. 20 tablet Cathlyn Parsons, NP   pantoprazole (PROTONIX) 40 MG tablet Take 1 tablet (40 mg total) by mouth daily.  30 tablet Cathlyn Parsons, NP      PDMP not reviewed this encounter.   Cathlyn Parsons, NP 01/15/21 [phone]

## 2021-01-15 NOTE — ED Triage Notes (Signed)
Pt presents with c/o abdominal pain x 3 days. States she has been feeling tired and burning sensation in her abdomen. States when she eats she feels nauseas and vomits yellow coloring.

## 2021-01-16 ENCOUNTER — Ambulatory Visit
Admission: RE | Admit: 2021-01-16 | Discharge: 2021-01-16 | Disposition: A | Discharge status: Home / Self Care | Payer: Self-pay | Source: Ambulatory Visit | Attending: Obstetrics and Gynecology | Admitting: Obstetrics and Gynecology

## 2021-01-16 ENCOUNTER — Other Ambulatory Visit: Payer: Self-pay | Admitting: Obstetrics and Gynecology

## 2021-01-16 DIAGNOSIS — R9389 Abnormal findings on diagnostic imaging of other specified body structures: Secondary | ICD-10-CM

## 2021-01-16 LAB — H. PYLORI ANTIBODY, IGG: H Pylori IgG: 0.79 Index Value (ref 0.00–0.79)

## 2021-02-12 ENCOUNTER — Other Ambulatory Visit: Payer: Self-pay | Admitting: Nurse Practitioner

## 2021-02-12 DIAGNOSIS — E559 Vitamin D deficiency, unspecified: Secondary | ICD-10-CM

## 2021-02-26 ENCOUNTER — Ambulatory Visit: Payer: Medicaid Other | Admitting: Nurse Practitioner

## 2021-02-26 ENCOUNTER — Other Ambulatory Visit: Payer: Self-pay

## 2021-02-27 ENCOUNTER — Ambulatory Visit (INDEPENDENT_AMBULATORY_CARE_PROVIDER_SITE_OTHER): Payer: Medicaid Other | Admitting: Nurse Practitioner

## 2021-02-27 DIAGNOSIS — H6061 Unspecified chronic otitis externa, right ear: Secondary | ICD-10-CM | POA: Diagnosis not present

## 2021-02-27 MED ORDER — NEOMYCIN-POLYMYXIN-HC 1 % OT SOLN: 3.0000 [drp] | Freq: Four times a day (QID) | OTIC | 0 refills | Status: AC

## 2021-02-27 NOTE — Patient Instructions (Signed)
You were seen today in the [facility] for right ear infection. You were prescribed medications, please take as directed. Please follow up as needed.

## 2021-02-27 NOTE — Progress Notes (Signed)
[place], [place]  [address] Phone:  [phone]   Fax:  [phone] Subjective:   Patient ID: Stacy Conner, female    DOB: [birth year], 51 y.o.   MRN: [id number]  Chief Complaint  Patient presents with   Ear Pain    Has been going on for 2 to 3 months. Pain in head and eye. Patient also stated that she has not been to sleep for 3 days.   HPI Stacy Conner 51 y.o. female  has no past medical history on file. To the [facility] for right ear pain.  Patient states that she has had right ear pain and itching x 2-3 months, since going to [place]. Was treated for infection while in [place], but was unable to afford medication. Endorses intermittent changes in hearing, with some tinnitus. Currently rates pain 10/10 and describes as throbbing and sharp. Denies any drainage from ear. Denies any fever. Denies any other complaints today.  Denies any fatigue, chest pain, shortness of breath, HA or dizziness. Denies any blurred vision, numbness or tingling.   No past medical history on file.  No past surgical history on file.  Family History  Family history unknown: Yes    Social History   Socioeconomic History   Marital status: Married    Spouse name: Not on file   Number of children: Not on file   Years of education: Not on file   Highest education level: Not on file  Occupational History   Not on file  Tobacco Use   Smoking status: Never   Smokeless tobacco: Never  Vaping Use   Vaping Use: Never used  Substance and Sexual Activity   Alcohol use: Never   Drug use: Never   Sexual activity: Yes    Birth control/protection: None  Other Topics Concern   Not on file  Social History Narrative   Not on file   Social Determinants of Health   Financial Resource Strain: Not on file  Food Insecurity: Not on file  Transportation Needs: Not on file  Physical Activity: Not on file  Stress: Not on file  Social  Connections: Not on file  Intimate Partner Violence: Not on file    Outpatient Medications Prior to Visit  Medication Sig Dispense Refill   butalbital-acetaminophen-caffeine (FIORICET) 50-325-40 MG tablet Take 1 tablet by mouth every 6 (six) hours as needed for headache. 14 tablet 0   dicyclomine (BENTYL) 10 MG capsule Take 1 capsule (10 mg total) by mouth 3 (three) times daily as needed (abdominal pain). 20 capsule 0   ondansetron (ZOFRAN) 4 MG tablet Take 1 tablet (4 mg total) by mouth every 8 (eight) hours as needed for nausea or vomiting. 20 tablet 0   pantoprazole (PROTONIX) 40 MG tablet Take 1 tablet (40 mg total) by mouth daily. 30 tablet 0   Vitamin D, Ergocalciferol, (DRISDOL) 1.25 MG (50000 UNIT) CAPS capsule TAKE 1 CAPSULE BY MOUTH 1 TIME A WEEK FOR 8 DOSES 8 capsule 0   No facility-administered medications prior to visit.    No Known Allergies  Review of Systems  Constitutional:  Negative for chills, fever and malaise/fatigue.  HENT:  Positive for ear pain, hearing loss and tinnitus. Negative for congestion, ear discharge, nosebleeds, sinus pain and sore throat.   Eyes: Negative.   Respiratory:  Negative for cough, shortness of breath and stridor.   Cardiovascular:  Negative for chest pain, palpitations and leg swelling.  Gastrointestinal:  Negative for abdominal pain, blood in stool, constipation, diarrhea, nausea and vomiting.  Skin: Negative.   Neurological: Negative.   Psychiatric/Behavioral:  Negative for depression. The patient is not nervous/anxious.   All other systems reviewed and are negative.     Objective:    Physical Exam Vitals reviewed.  Constitutional:      General: She is not in acute distress.    Appearance: Normal appearance. She is normal weight.  HENT:     Head: Normocephalic.     Right Ear: Tympanic membrane and ear canal normal. There is no impacted cerumen.     Left Ear: Tympanic membrane, ear canal and external ear normal. There is impacted  cerumen.     Ears:     Comments: Pain with palpation of diffuse outer ear    Nose: Nose normal. No congestion.     Mouth/Throat:     Mouth: Mucous membranes are moist.     Pharynx: Oropharynx is clear.  Eyes:     Extraocular Movements: Extraocular movements intact.     Conjunctiva/sclera: Conjunctivae normal.     Pupils: Pupils are equal, round, and reactive to light.  Neck:     Vascular: No carotid bruit.  Cardiovascular:     Rate and Rhythm: Normal rate and regular rhythm.     Pulses: Normal pulses.     Heart sounds: Normal heart sounds.     Comments: No obvious peripheral edema Pulmonary:     Effort: Pulmonary effort is normal.     Breath sounds: Normal breath sounds.  Musculoskeletal:     Cervical back: Normal range of motion and neck supple. No rigidity or tenderness.  Lymphadenopathy:     Cervical: No cervical adenopathy.  Skin:    General: Skin is warm and dry.     Capillary Refill: Capillary refill takes less than 2 seconds.  Neurological:     General: No focal deficit present.     Mental Status: She is alert and oriented to person, place, and time.  Psychiatric:        Mood and Affect: Mood normal.        Behavior: Behavior normal.        Thought Content: Thought content normal.        Judgment: Judgment normal.    There were no vitals taken for this visit. Wt Readings from Last 3 Encounters:  12/21/20 156 lb 0.7 oz (70.8 kg)  11/30/20 159 lb 9.6 oz (72.4 kg)     There is no immunization history on file for this patient.  Diabetic Foot Exam - Simple   No data filed     No results found for: TSH Lab Results  Component Value Date   WBC 6.3 11/30/2020   HGB 13.1 11/30/2020   HCT 39.1 11/30/2020   MCV 88 11/30/2020   PLT 285 11/30/2020   Lab Results  Component Value Date   NA 138 11/30/2020   K 3.9 11/30/2020   CO2 24 11/30/2020   GLUCOSE 93 11/30/2020   BUN 16 11/30/2020   CREATININE 0.74 11/30/2020   BILITOT 0.3 11/30/2020   ALKPHOS 71  11/30/2020   AST 15 11/30/2020   ALT 12 11/30/2020   PROT 7.4 11/30/2020   ALBUMIN 4.3 11/30/2020   CALCIUM 9.3 11/30/2020   EGFR 100 11/30/2020   Lab Results  Component Value Date   CHOL 190 11/30/2020   Lab Results  Component Value Date   HDL 58 11/30/2020   Lab  Results  Component Value Date   LDLCALC 121 (H) 11/30/2020   Lab Results  Component Value Date   TRIG 56 11/30/2020   Lab Results  Component Value Date   CHOLHDL 3.3 11/30/2020   Lab Results  Component Value Date   HGBA1C 5.5 12/21/2020       Assessment & Plan:   Problem List Items Addressed This Visit   None Visit Diagnoses     Chronic otitis externa of right ear, unspecified type    -  Primary   Relevant Medications   NEOMYCIN-POLYMYXIN-HYDROCORTISONE (CORTISPORIN) 1 % SOLN OTIC solution Discussed non pharmacological methods for managing infection Informed to take OTC medications as needed for pain.    Follow up in 1-2 wks if symptoms remain unresolved, sooner as needed    I am having Sharran H. Darin start on NEOMYCIN-POLYMYXIN-HYDROCORTISONE. I am also having her maintain her butalbital-acetaminophen-caffeine, dicyclomine, ondansetron, pantoprazole, and Vitamin D (Ergocalciferol).  Meds ordered this encounter  Medications   NEOMYCIN-POLYMYXIN-HYDROCORTISONE (CORTISPORIN) 1 % SOLN OTIC solution    Sig: Place 3 drops into the right ear every 6 (six) hours for 10 days.    Dispense:  6 mL    Refill:  0     Teena Dunk, NP

## 2021-03-15 ENCOUNTER — Ambulatory Visit (INDEPENDENT_AMBULATORY_CARE_PROVIDER_SITE_OTHER): Payer: Medicaid Other | Admitting: Nurse Practitioner

## 2021-03-15 ENCOUNTER — Encounter: Payer: Self-pay | Admitting: Nurse Practitioner

## 2021-03-15 ENCOUNTER — Other Ambulatory Visit: Payer: Self-pay

## 2021-03-15 VITALS — BP 114/80 | HR 85 | Resp 16

## 2021-03-15 DIAGNOSIS — H8101 Meniere's disease, right ear: Secondary | ICD-10-CM

## 2021-03-15 DIAGNOSIS — H6061 Unspecified chronic otitis externa, right ear: Secondary | ICD-10-CM | POA: Diagnosis not present

## 2021-03-15 MED ORDER — AMOXICILLIN-POT CLAVULANATE 875-125 MG PO TABS: 1.0000 | ORAL_TABLET | Freq: Two times a day (BID) | ORAL | 0 refills | Status: DC

## 2021-03-15 MED ORDER — FUROSEMIDE 20 MG PO TABS: 20.0000 mg | ORAL_TABLET | Freq: Every day | ORAL | 0 refills | Status: DC

## 2021-03-15 NOTE — Progress Notes (Signed)
[place], [place]  [address] Phone:  [phone]   Fax:  [phone] Subjective:   Patient ID: Stacy Conner, female    DOB: November 05, 1970, 51 y.o.   MRN: [id number]  Chief Complaint  Patient presents with   Ear Pain   Tinnitus   HPI Stacy Conner 51 y.o. female with no significant medical history to the [facility] for headache and right ear pain.  Patient has had right ear pain with intermittent headaches for several weeks. Completed prescribed ear drops with no improvement in symptoms. Rates pain 10/10 and describes as sharp and pressure. Endorses having drainage from ear, unknown color. States that when she lays down she feel like water is draining from her ear. Has had some decreased hearing from right ear. Currently not working, studying English at [employer]. Endorses some vision changes and dizziness. Denies any recent trauma or injury. Denies any other complaints today.  Denies any fever. Denies any fatigue, chest pain, shortness of breath or HA. Denies any blurred vision, numbness or tingling.   No past medical history on file.  No past surgical history on file.  Family History  Family history unknown: Yes    Social History   Socioeconomic History   Marital status: Married    Spouse name: Not on file   Number of children: Not on file   Years of education: Not on file   Highest education level: Not on file  Occupational History   Not on file  Tobacco Use   Smoking status: Never   Smokeless tobacco: Never  Vaping Use   Vaping Use: Never used  Substance and Sexual Activity   Alcohol use: Never   Drug use: Never   Sexual activity: Yes    Birth control/protection: None  Other Topics Concern   Not on file  Social History Narrative   Not on file   Social Determinants of Health   Financial Resource Strain: Not on file  Food Insecurity: Not on file  Transportation Needs: Not on file   Physical Activity: Not on file  Stress: Not on file  Social Connections: Not on file  Intimate Partner Violence: Not on file    Outpatient Medications Prior to Visit  Medication Sig Dispense Refill   pantoprazole (PROTONIX) 40 MG tablet Take 1 tablet (40 mg total) by mouth daily. 30 tablet 0   butalbital-acetaminophen-caffeine (FIORICET) 50-325-40 MG tablet Take 1 tablet by mouth every 6 (six) hours as needed for headache. 14 tablet 0   dicyclomine (BENTYL) 10 MG capsule Take 1 capsule (10 mg total) by mouth 3 (three) times daily as needed (abdominal pain). 20 capsule 0   ondansetron (ZOFRAN) 4 MG tablet Take 1 tablet (4 mg total) by mouth every 8 (eight) hours as needed for nausea or vomiting. 20 tablet 0   Vitamin D, Ergocalciferol, (DRISDOL) 1.25 MG (50000 UNIT) CAPS capsule TAKE 1 CAPSULE BY MOUTH 1 TIME A WEEK FOR 8 DOSES 8 capsule 0   No facility-administered medications prior to visit.    No Known Allergies  Review of Systems  Constitutional:  Negative for chills, fever and malaise/fatigue.  HENT:  Positive for ear discharge, ear pain and hearing loss. Negative for congestion, sinus pain, sore throat and tinnitus.   Eyes:  Positive for blurred vision. Negative for double vision, photophobia, pain, discharge and redness.  Respiratory:  Negative for cough, shortness of breath and stridor.   Cardiovascular:  Negative for chest pain, palpitations and leg swelling.  Gastrointestinal:  Negative for abdominal pain, blood in stool, constipation, diarrhea, nausea and vomiting.  Genitourinary: Negative.   Musculoskeletal: Negative.   Skin: Negative.   Neurological:  Positive for dizziness and headaches. Negative for tingling, tremors, sensory change, speech change, focal weakness, seizures, loss of consciousness and weakness.  Psychiatric/Behavioral:  Negative for depression. The patient is not nervous/anxious.   All other systems reviewed and are negative.     Objective:     Physical Exam Vitals reviewed.  Constitutional:      General: She is not in acute distress.    Appearance: Normal appearance. She is normal weight.  HENT:     Head: Normocephalic.     Right Ear: Tympanic membrane, ear canal and external ear normal. There is no impacted cerumen.     Left Ear: Tympanic membrane, ear canal and external ear normal. There is no impacted cerumen.     Nose: Nose normal. No congestion or rhinorrhea.     Mouth/Throat:     Mouth: Mucous membranes are moist.     Pharynx: Oropharynx is clear. No oropharyngeal exudate or posterior oropharyngeal erythema.  Eyes:     General: No scleral icterus.       Right eye: No discharge.        Left eye: No discharge.     Extraocular Movements: Extraocular movements intact.     Conjunctiva/sclera: Conjunctivae normal.     Pupils: Pupils are equal, round, and reactive to light.  Neck:     Vascular: No carotid bruit.  Cardiovascular:     Rate and Rhythm: Normal rate and regular rhythm.     Pulses: Normal pulses.     Heart sounds: Normal heart sounds.     Comments: No obvious peripheral edema Pulmonary:     Effort: Pulmonary effort is normal.     Breath sounds: Normal breath sounds.  Musculoskeletal:     Cervical back: Normal range of motion and neck supple. No rigidity or tenderness.  Lymphadenopathy:     Cervical: No cervical adenopathy.  Skin:    General: Skin is warm and dry.     Capillary Refill: Capillary refill takes less than 2 seconds.  Neurological:     General: No focal deficit present.     Mental Status: She is alert and oriented to person, place, and time.  Psychiatric:        Mood and Affect: Mood normal.        Behavior: Behavior normal.        Thought Content: Thought content normal.        Judgment: Judgment normal.    BP 114/80    Pulse 85    Resp 16    SpO2 100%  Wt Readings from Last 3 Encounters:  12/21/20 156 lb 0.7 oz (70.8 kg)  11/30/20 159 lb 9.6 oz (72.4 kg)     There is no  immunization history on file for this patient.  Diabetic Foot Exam - Simple   No data filed     No results found for: TSH Lab Results  Component Value Date   WBC 6.3 11/30/2020   HGB 13.1 11/30/2020   HCT 39.1 11/30/2020   MCV 88 11/30/2020   PLT 285 11/30/2020   Lab Results  Component Value Date   NA 138 11/30/2020   K 3.9 11/30/2020   CO2 24 11/30/2020   GLUCOSE 93 11/30/2020   BUN 16 11/30/2020  CREATININE 0.74 11/30/2020   BILITOT 0.3 11/30/2020   ALKPHOS 71 11/30/2020   AST 15 11/30/2020   ALT 12 11/30/2020   PROT 7.4 11/30/2020   ALBUMIN 4.3 11/30/2020   CALCIUM 9.3 11/30/2020   EGFR 100 11/30/2020   Lab Results  Component Value Date   CHOL 190 11/30/2020   Lab Results  Component Value Date   HDL 58 11/30/2020   Lab Results  Component Value Date   LDLCALC 121 (H) 11/30/2020   Lab Results  Component Value Date   TRIG 56 11/30/2020   Lab Results  Component Value Date   CHOLHDL 3.3 11/30/2020   Lab Results  Component Value Date   HGBA1C 5.5 12/21/2020       Assessment & Plan:   Problem List Items Addressed This Visit   None Visit Diagnoses   Concerned for Meniere's disease v chronic ear infection. Reassuring PE, will treat for both etiologies.   Chronic otitis externa of right ear, unspecified type    -  Primary   Relevant Medications   amoxicillin-clavulanate (AUGMENTIN) 875-125 MG tablet   Other Relevant Orders   Ambulatory referral to ENT   Meniere's disease of right ear       Relevant Medications   furosemide (LASIX) 20 MG tablet Given educational material on Meniere's disease Encouraged to take medications as prescribed  Encouraged to maintain follow up with ENT Given return and/ ED precautions   Other Relevant Orders   Ambulatory referral to ENT   Follow up in 1 mth for reevaluation of symptoms, sooner as needed     I am having Stacy Conner start on furosemide and amoxicillin-clavulanate. I am also having her maintain her  butalbital-acetaminophen-caffeine, dicyclomine, ondansetron, pantoprazole, and Vitamin D (Ergocalciferol).  Meds ordered this encounter  Medications   furosemide (LASIX) 20 MG tablet    Sig: Take 1 tablet (20 mg total) by mouth daily.    Dispense:  30 tablet    Refill:  0   amoxicillin-clavulanate (AUGMENTIN) 875-125 MG tablet    Sig: Take 1 tablet by mouth 2 (two) times daily.    Dispense:  20 tablet    Refill:  0     Teena Dunk, NP

## 2021-03-15 NOTE — Progress Notes (Signed)
Patient states to having Shooting pain on upper right side of head and having a headache. She also stated of having having an issue with balance.  Drops that were given last visit pt states to making her ears feel blocked.

## 2021-03-15 NOTE — Patient Instructions (Addendum)
You were seen today in the [facility] for right ear pain.  You were prescribed medications, please take as directed. Please follow up in 1 mths for reevaluation right ear pain. Referral completed to ENT. ?? ?? ??? ??????   ??? ????? ?? ???? ???? ?????? ??????? ?? ?????. ????? ???? ??????? ??? ?????. ?? ?? ????? ??? ?????? ???? ??? ?????? ?? ????? ???? ???? ?????. ??? ???? ??????? ??? ??? ?????? ????? ???? ????? ??????. ?? ?? ????? ??? ??????? ????? ???????? ???? ????? ?? ??????? ??????? ???????: ??????? - ??????? ????? ???? ???? ??? ?????? ??????? ?????? ???? ??? ?????? ???? ?? ???? ?? ???? ?????? ?? ????. ??? ???? ????? ?? ?????? ????? ?? ????. ???? ??? ??????? ???????? ??????? ???? ?? ?????? (???????) ???????. ????? ???????? ???? ????? ????? ?? ????? ?? ??????? ????? ?? ???? ???? ?????? ????? ?????? ?? ??? ????? ??? ??? ?????. ????? ????? - ??????? ????? ???? ???? ??? ???? ????? ?? ??? ???? ?? ???? ?? ?????? ???? ?? ??? ????? ?? ?? ???? ???????. ?????? ?? ???? ???????? ???? ????? ?? ??????? ???? ??? ???????? ??? ?????? ?? ??? ???? ????????. ??? ???? ??? ??????? ?????? ?? ????? ??????? ?? ???? ???? ????? ????. ?????? ????? - ?? ???? ???? ??????? ?? ????? ??? ?? ?????? ????? ????? ????? ???? ??????. ???? ??? ????? ?? ???? ?????? ????? ?????? ????? ???? ???? ?????? ??? 8 ????? ??? 10 ?????. ??? ???? ??? ???????? ???? ????? ???? ?? ??????. ?? ???? ?????? ???? ?????? ?? ???? ?????? ???? ?????? ???? ???? ???????? ???? ??????. ?? ??????? ?? ???? ????? ?? ????? ?????? ??? ????? ?? ??? ??? ????? ?? ??? ????? ?? ??? ???? ?? ???? ?????? ??? ??????? ???? ???? ???? ?????? ???. ???? ????? ?????? ?? ?????? ?? ?????? ?????? ?????. ??? ??? ???????? ?? ??? ????? ?????? ???? ?????? ??????? ????. ??????? ????? (????? ????? "???? ?????") - ???? ?? ???? ???? ??? ????? ????. ??????? ??????? - ?? ??? ??? ??????????? ????? ????? ????? ???? "??????? ??????????" ?????? ?? ????? ????? ????? ????? ????? ????? ???? ??????. ???????  ?????????? ??????? ????? ??? ???? ??? ????? ?? ??? ??? ???? ?????. ?? ???? ????? ???? ????? ??????? ???? ???????. ?? ???? ????? ????? ?????? ?????? ????? ?????? ?? ??? ???? ????? ???? ????. ???? ???????? ??????? ????? ????? ?? ??????. ??????? ??????? ?????????? ?? ?????? ????? ???? ?????? ????? ????? ?? ?????. ??? ??? ???????? ??? ????? ?????? ???? ??????? ??????? ???????? ????? ????? ?? ??????? ??????? ??????????. ??? ???? ????? ???????? ???? ???????. ?? ???? ?? ??? ?????? ?????? ?? ?????? ????? ?????? ???. ?? ???? ????? ??? ????? ????? ??????? ?????????? ???? ???? ??????? ?????. ??? ???? ??? ??????? ??????????: ?????? ???????? ??????? ??????????? ?????? ???????? (MSG) - ??? ???? ????? ??????? ??? ??? ??????? (??? ?????? ?????? ???????? ???????) ???? ?????? ????. ???????? ?? ?????? ?? ????????? ???????? ?? ????????? ?????? ???? ????? ??? ???? ???????? ??????? ??? ???? ????? ?????? ?? ???????? ??? ??? ??????. ??? ??? ???? ??? ?????? ???? ???????? ?? ???: ?????? ???? ?????? ???????? - ???? ?? ?????? ??? ?????? ??????? ??? ???? ???????. ?????? ???? "????? ?????" - ???? ?? ????? ?? ???? ??????? ?? ?????? ?? ????? ????????. ???? ????? ??? ??????? ??????? ???????? ?? ??????? ?? ???????. ??????? ???? ???????? ?? ??????? - ?? ????? ????? ??? ?????? ???? ????? (???? ??????) ?????? ??? ????????. ???????? ??????????? - ?? ???? ????? ??????? ????? ?? ????? ?????? ???????? ??? ????? ?? ??????? ????? ?????. ????? ??????? ?? ???? ???? ?? ????? ?????? ?? ?????? ?? ????. ???????? - ?? ???? ????? ?????? ????? ??? ???? ??????? ???? ????? ???? ???? ?????? ????? ????? ?? ??????? ?? ????? ?? ?????? ?????? ????? ?????? ???. ??? ??????? ?? ???? ???? ????. ??????? ????? - ?? ????? ??? ????? ??? ??? ??? ????? ?? ??? ???? ?? ?????. ??? ???? ??? ?????? ???? ???????? ??? ?????? ????? ????????. ??? ???? ???? ??? ?? ???? ?? ????????? ???? ?? ?????? ????? ????? ?? ???? ??? ??????. ?????? ??? ??? ???????  ????? ??????: ??????  (??? ?? ??????) (?????????) ????? ??????: ???? ????? (???? ?? ?????) (?????????)  Patient education: Tinnitus (ringing in the ears) (Beyond the Basics)Patient education: Vertigo (Beyond the Basics) ??? ????? ???? ????????? ???? ???? ???? ???? ??????? ????? ????????? ?? ??? ??????? ????? ????????? ?? ??? ???????. ??? ???? ?????? ??

## 2021-04-15 ENCOUNTER — Ambulatory Visit: Payer: Medicaid Other | Admitting: Nurse Practitioner

## 2021-05-23 ENCOUNTER — Encounter: Payer: Medicaid Other | Admitting: Obstetrics and Gynecology

## 2021-05-23 ENCOUNTER — Ambulatory Visit: Payer: Medicaid Other | Admitting: Nurse Practitioner

## 2021-05-23 ENCOUNTER — Encounter: Payer: Self-pay | Admitting: Nurse Practitioner

## 2021-05-23 VITALS — BP 115/84 | HR 86 | Temp 98.4°F | Ht 66.0 in | Wt 157.0 lb

## 2021-05-23 DIAGNOSIS — G8929 Other chronic pain: Secondary | ICD-10-CM

## 2021-05-23 DIAGNOSIS — M545 Low back pain, unspecified: Secondary | ICD-10-CM | POA: Diagnosis not present

## 2021-05-23 DIAGNOSIS — H6061 Unspecified chronic otitis externa, right ear: Secondary | ICD-10-CM | POA: Diagnosis not present

## 2021-05-23 MED ORDER — DICLOFENAC SODIUM 1 % EX GEL: 4.0000 g | Freq: Four times a day (QID) | CUTANEOUS | 0 refills | Status: DC | PRN

## 2021-05-23 MED ORDER — KETOROLAC TROMETHAMINE 30 MG/ML IJ SOLN: 15.0000 mg | Freq: Once | INTRAMUSCULAR | Status: AC

## 2021-05-23 MED ORDER — CYCLOBENZAPRINE HCL 10 MG PO TABS: 10.0000 mg | ORAL_TABLET | Freq: Three times a day (TID) | ORAL | 0 refills | Status: DC | PRN

## 2021-05-23 NOTE — Patient Instructions (Signed)
You were seen today in the [facility] for right ear pain and low back pain. You were prescribed medications, please take as directed. Please follow up in 6 mths for wellness visit.  ?
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
## 2021-06-02 ENCOUNTER — Encounter (HOSPITAL_COMMUNITY): Payer: Self-pay | Admitting: *Deleted

## 2021-06-02 ENCOUNTER — Other Ambulatory Visit: Payer: Self-pay

## 2021-06-02 ENCOUNTER — Ambulatory Visit (HOSPITAL_COMMUNITY)
Admission: EM | Admit: 2021-06-02 | Discharge: 2021-06-02 | Disposition: A | Discharge status: Home / Self Care | Payer: Medicaid Other | Attending: Internal Medicine | Admitting: Internal Medicine

## 2021-06-02 DIAGNOSIS — H5713 Ocular pain, bilateral: Secondary | ICD-10-CM

## 2021-06-02 LAB — CBG MONITORING, ED: Glucose-Capillary: 118 mg/dL — ABNORMAL HIGH (ref 70–99)

## 2021-06-02 MED ORDER — IBUPROFEN 800 MG PO TABS: 800.0000 mg | ORAL_TABLET | Freq: Once | ORAL | Status: DC

## 2021-06-02 MED ORDER — IBUPROFEN 800 MG PO TABS
ORAL_TABLET | ORAL | Status: AC
  Filled 2021-06-02: qty 1

## 2021-06-02 NOTE — ED Triage Notes (Signed)
Pt reports blurred vision for one week. ?

## 2021-06-02 NOTE — ED Triage Notes (Signed)
Please print hard copies of RX if needed. Pt does not know the location of [facility] she uses. ?

## 2021-06-02 NOTE — ED Provider Notes (Signed)
?[facility] ? ? ? ?CSN: [id number] ?Arrival date & time: 06/02/21  1039 ? ? ?  ? ?History   ?Chief Complaint ?Chief Complaint  ?Patient presents with  ? Eye Pain  ? ? ?HPI ?Stacy Conner is a 51 y.o. female Arabic speaking, healthy presents to urgent care today with complaints of eye pain.  History taken via interpreter.  Patient reports 2-week history of intermittent blurred vision and bilateral eye pain.  Patient states symptoms significantly worse when focusing on her phone for prolonged period of time.  She denies any contact lens or glasses use, no history of headaches.  Patient describes discomfort on lids and forehead present at time of exam.  She is unable to describe pain though denies any nausea or photophobia.  She denies any recent trauma, fever, chills or weakness.  No recent international travel.  Last eye exam done 9/22 normal.  Patient has not taken anything for pain.  She is currently fasting for Ramadan. ? ? ? ?History reviewed. No pertinent past medical history. ? ?There are no problems to display for this patient. ? ? ?History reviewed. No pertinent surgical history. ? ?OB History   ?No obstetric history on file. ?  ? ? ? ?Home Medications   ? ?Prior to Admission medications   ?Medication Sig Start Date End Date Taking? Authorizing Provider  ?amoxicillin-clavulanate (AUGMENTIN) 875-125 MG tablet Take 1 tablet by mouth 2 (two) times daily. ?Patient not taking: Reported on 05/23/2021 03/15/21   Bo Merino I, NP  ?butalbital-acetaminophen-caffeine (FIORICET) 50-325-40 MG tablet Take 1 tablet by mouth every 6 (six) hours as needed for headache. ?Patient not taking: Reported on 05/23/2021 12/21/20   Bo Merino I, NP  ?cyclobenzaprine (FLEXERIL) 10 MG tablet Take 1 tablet (10 mg total) by mouth 3 (three) times daily as needed for muscle spasms. 05/23/21   Bo Merino I, NP  ?diclofenac Sodium (VOLTAREN) 1 % GEL Apply 4 g topically 4 (four) times daily as needed (low back  pain). 05/23/21   Bo Merino I, NP  ?dicyclomine (BENTYL) 10 MG capsule Take 1 capsule (10 mg total) by mouth 3 (three) times daily as needed (abdominal pain). ?Patient not taking: Reported on 05/23/2021 12/21/20   Bo Merino I, NP  ?furosemide (LASIX) 20 MG tablet Take 1 tablet (20 mg total) by mouth daily. 03/15/21 04/14/21  Bo Merino I, NP  ?ondansetron (ZOFRAN) 4 MG tablet Take 1 tablet (4 mg total) by mouth every 8 (eight) hours as needed for nausea or vomiting. ?Patient not taking: Reported on 05/23/2021 01/15/21   Carvel Getting, NP  ?pantoprazole (PROTONIX) 40 MG tablet Take 1 tablet (40 mg total) by mouth daily. ?Patient not taking: Reported on 05/23/2021 01/15/21   Carvel Getting, NP  ?Vitamin D, Ergocalciferol, (DRISDOL) 1.25 MG (50000 UNIT) CAPS capsule TAKE 1 CAPSULE BY MOUTH 1 TIME A WEEK FOR 8 DOSES ?Patient not taking: Reported on 05/23/2021 02/12/21   Bo Merino I, NP  ? ? ?Family History ?Family History  ?Family history unknown: Yes  ? ? ?Social History ?Social History  ? ?Tobacco Use  ? Smoking status: Never  ? Smokeless tobacco: Never  ?Vaping Use  ? Vaping Use: Never used  ?Substance Use Topics  ? Alcohol use: Never  ? Drug use: Never  ? ? ? ?Allergies   ?Patient has no known allergies. ? ? ?Review of Systems ?As stated in HPI otherwise negative ? ? ?Physical Exam ?Triage Vital Signs ?ED Triage Vitals [06/02/21  1213]  ?Enc Vitals Group  ?   BP (!) 101/59  ?   Pulse Rate 93  ?   Resp 18  ?   Temp 99 ?F (37.2 ?C)  ?   Temp src   ?   SpO2 98 %  ?   Weight   ?   Height   ?   Head Circumference   ?   Peak Flow   ?   Pain Score   ?   Pain Loc   ?   Pain Edu?   ?   Excl. in [address]?   ? ?No data found. ? ?Updated Vital Signs ?BP (!) 101/59   Pulse 93   Temp 99 ?F (37.2 ?C)   Resp 18   LMP 05/19/2021   SpO2 98%  ? ?Visual Acuity ?Right Eye Distance:   ?Left Eye Distance:   ?Bilateral Distance:   ? ?Right Eye Near:   ?Left Eye Near:    ?Bilateral Near:    ? ?Physical Exam ?Constitutional:    ?   General: She is not in acute distress. ?   Appearance: Normal appearance. She is normal weight. She is not ill-appearing or toxic-appearing.  ?HENT:  ?   Head: Normocephalic.  ?   Right Ear: Tympanic membrane normal.  ?   Left Ear: Tympanic membrane normal.  ?   Nose: Nose normal. No congestion or rhinorrhea.  ?   Comments: No sinus tenderness ?   Mouth/Throat:  ?   Mouth: Mucous membranes are moist.  ?   Pharynx: No oropharyngeal exudate or posterior oropharyngeal erythema.  ?Eyes:  ?   General: No scleral icterus.    ?   Right eye: No discharge.     ?   Left eye: No discharge.  ?   Extraocular Movements: Extraocular movements intact.  ?   Conjunctiva/sclera: Conjunctivae normal.  ?   Pupils: Pupils are equal, round, and reactive to light.  ?Pulmonary:  ?   Effort: Pulmonary effort is normal.  ?Musculoskeletal:     ?   General: Normal range of motion.  ?   Cervical back: Normal range of motion and neck supple. No rigidity or tenderness.  ?Lymphadenopathy:  ?   Cervical: No cervical adenopathy.  ?Skin: ?   General: Skin is warm and dry.  ?   Findings: No bruising, erythema, lesion or rash.  ?Neurological:  ?   General: No focal deficit present.  ?   Mental Status: She is alert and oriented to person, place, and time.  ?Psychiatric:     ?   Mood and Affect: Mood normal.     ?   Behavior: Behavior normal.  ? ? ? ?UC Treatments / Results  ?Labs ?(all labs ordered are listed, but only abnormal results are displayed) ?Labs Reviewed  ?CBG MONITORING, ED - Abnormal; Notable for the following components:  ?    Result Value  ? Glucose-Capillary 118 (*)   ? All other components within normal limits  ? ? ?EKG ? ? ?Radiology ?No results found. ? ?Procedures ?Procedures (including critical care time) ? ?Medications Ordered in UC ?Medications  ?ibuprofen (ADVIL) tablet 800 mg (800 mg Oral Not Given 06/02/21 1253)  ? ? ?Initial Impression / Assessment and Plan / UC Course  ?I have reviewed the triage vital signs and the  nursing notes. ? ?Pertinent labs & imaging results that were available during my care of the patient were reviewed by me and considered in my  medical decision making (see chart for details). ? ?Bilateral eye pain, blurred vision ?-Symptoms intermittent x 2 weeks and exacerbated by prolonged phone use.  Low suspicion for infectious etiology or corneal abrasion as exam is benign and symptoms, ago.  History seems more consistent with cluster or tension type headache. ?-CBG normal, exam unremarkable, VSS ?-Patient refused IM Toradol and oral NSAID due to fasting.  Unable to check visual acuity as patient is not familiar with [place] alphabet ?-Patient instructed to try Motrin this evening once fasting and is to see if this helps alleviate her symptoms.  If symptoms persist or should any vision loss occur, patient will need to go directly to the emergency department.  Otherwise I have given her referral to ophthalmology at her request. ? ?Reviewed expections re: course of current medical issues. Questions answered. ?Outlined signs and symptoms indicating need for more acute intervention. ?Pt verbalized understanding. ?AVS given ? ?Final Clinical Impressions(s) / UC Diagnoses  ? ?Final diagnoses:  ?Pain of both eyes  ? ? ? ?Discharge Instructions   ? ?  ?I am uncertain the exact cause of your pain.  It sounds like you may be having a type of headache known as cluster headache.  Once you are fasting is over, please try to take ibuprofen 600mg  every 8 hours as needed.  I have also given you the number to follow-up with ophthalmology.  As we discussed, if symptoms should worsen or should you experience any vision loss you will need to go directly to the emergency department. ? ? ? ? ?ED Prescriptions   ?None ?  ? ?PDMP not reviewed this encounter. ?  ?Rudolpho Sevin, NP ?06/02/21 1332 ? ?

## 2021-06-02 NOTE — Discharge Instructions (Addendum)
I am uncertain the exact cause of your pain.  It sounds like you may be having a type of headache known as cluster headache.  Once you are fasting is over, please try to take ibuprofen 600mg  every 8 hours as needed.  I have also given you the number to follow-up with ophthalmology.  As we discussed, if symptoms should worsen or should you experience any vision loss you will need to go directly to the emergency department. ?

## 2021-06-10 ENCOUNTER — Encounter: Payer: Self-pay | Admitting: Family Medicine

## 2021-06-10 ENCOUNTER — Other Ambulatory Visit (HOSPITAL_COMMUNITY)
Admission: RE | Admit: 2021-06-10 | Discharge: 2021-06-10 | Disposition: A | Discharge status: Home / Self Care | Payer: Medicaid Other | Source: Ambulatory Visit | Attending: Family Medicine | Admitting: Family Medicine

## 2021-06-10 ENCOUNTER — Ambulatory Visit (INDEPENDENT_AMBULATORY_CARE_PROVIDER_SITE_OTHER): Payer: Medicaid Other | Admitting: Family Medicine

## 2021-06-10 VITALS — BP 117/80 | HR 83 | Ht 62.0 in | Wt 157.0 lb

## 2021-06-10 DIAGNOSIS — Z113 Encounter for screening for infections with a predominantly sexual mode of transmission: Secondary | ICD-10-CM

## 2021-06-10 DIAGNOSIS — Z124 Encounter for screening for malignant neoplasm of cervix: Secondary | ICD-10-CM | POA: Diagnosis present

## 2021-06-10 DIAGNOSIS — R102 Pelvic and perineal pain unspecified side: Secondary | ICD-10-CM

## 2021-06-10 DIAGNOSIS — R42 Dizziness and giddiness: Secondary | ICD-10-CM | POA: Diagnosis not present

## 2021-06-10 NOTE — Progress Notes (Signed)
? ?GYNECOLOGY OFFICE VISIT NOTE ? ?History:  ? Stacy Conner is a 51 y.o. [phone] here today to establish care and discuss concern that she was told her "uterus was in the wrong place". ? ?She reports that she was told this while living in [place] during an [place] while pregnant about 3 years ago. Since this pregnancy, she has been having pelvic pain the end of each period. Usually monthly cycle, but can come twice monthly sometimes. She was told that her uterus goes to the left and that this is the cause of her pain. She is unsure if they found anything else in the past. She denies any concern for prolapse, urinary incontinence, or change in BM.  ? ?She is hopeful to get this taken of care so that she can then try to get pregnant again.  ? ?While walking out from our visit, she mentions that sometimes she feels lightheaded. Reports it seems to be random at times but improves spontaneously.  ? ?In person Arabic interpreter present for visit.  ?  ?History reviewed. No pertinent past medical history. ? ?Past Surgical History:  ?Procedure Laterality Date  ? NO PAST SURGERIES    ? ? ?The following portions of the patient's history were reviewed and updated as appropriate: allergies, current medications, past family history, past medical history, past social history, past surgical history and problem list.  ? ?Health Maintenance:  Due for papsmear. Normal mammogram 4 months ago.  ? ?Review of Systems:  ?Pertinent items noted in HPI and remainder of comprehensive ROS otherwise negative. ? ?Physical Exam:  ?BP 117/80   Pulse 83   Ht 5\' 2"  (1.575 m)   Wt 157 lb (71.2 kg)   LMP 06/06/2021 (Exact Date)   BMI 28.72 kg/m?  ?CONSTITUTIONAL: Well-developed, well-nourished female in no acute distress.  ?HEENT:  Normocephalic, atraumatic. External right and left ear normal. No scleral icterus.  ?NECK: Normal range of motion, supple, no masses noted on observation ?SKIN: No rash noted. Not diaphoretic. No erythema. No  pallor. ?MUSCULOSKELETAL: Normal range of motion. No edema noted. ?NEUROLOGIC: Alert and oriented to person, place, and time. Normal muscle tone coordination.  ?PSYCHIATRIC: Normal mood and affect. Normal behavior. Normal judgment and thought content. ?CARDIOVASCULAR: Normal heart rate noted ?RESPIRATORY: Effort and breath sounds normal, no problems with respiration noted ?ABDOMEN: No masses noted. No other overt distention noted.   ?PELVIC: Normal appearing external genitalia; normal urethral meatus; normal appearing vaginal mucosa and cervix.  No abnormal discharge noted.  Normal uterine size, no other palpable masses, no uterine or adnexal tenderness. Performed in the presence of a chaperone ? ?Labs and Imaging ?No results found.    ?Assessment and Plan:  ? ?1. Pelvic pain ?In the setting of being told previously that her uterus was in the "wrong place" and that this was the etiology of her discomfort. Discussed that it is very common and not usually pathologic for different positions of the uterus. No imaging since in the [place], will evaluate.  ?- US PELVIC COMPLETE WITH TRANSVAGINAL; Future ? ?2. Encounter for Papanicolaou smear of cervix ?No cervical abnormalities seen.  ?- Cytology - PAP( [facility]) ? ?3. Screening examination for STD (sexually transmitted disease) ?No exposure or symptoms. Only requested vaginal swabs, no lab STD screening.  ? ?4. Lightheadedness ?Intermittent. Mentioned briefly after visit, encouraged further discussion with PCP in addition to adequate hydration and protein rich foods throughout the day.  ?- CBC ?- Ferritin  ? ?Patient also hopes  to try for another pregnancy pending results. Last pregnancy 3 years ago. Set expectations that it can be significant more difficult to conceive and place her at a higher risk at her age. She endorsed understanding.   ? ?Return for after [place] results .   ? ?Darrelyn Hillock, DO  ?OB Fellow, Faculty Practice ?[place] [facility] ?06/12/2021 11:16 AM ?

## 2021-06-10 NOTE — Progress Notes (Signed)
New GYN presents for "uterus in wrong place" ? ?Last Mammogram was 4 months ago. ?

## 2021-06-11 ENCOUNTER — Ambulatory Visit: Payer: Medicaid Other | Admitting: Physician Assistant

## 2021-06-11 LAB — CBC
Hematocrit: 39.6 % (ref 34.0–46.6)
Hemoglobin: 13.3 g/dL (ref 11.1–15.9)
MCH: 30.6 pg (ref 26.6–33.0)
MCHC: 33.6 g/dL (ref 31.5–35.7)
MCV: 91 fL (ref 79–97)
Platelets: 277 10*3/uL (ref 150–450)
RBC: 4.34 x10E6/uL (ref 3.77–5.28)
RDW: 12.1 % (ref 11.7–15.4)
WBC: 6 10*3/uL (ref 3.4–10.8)

## 2021-06-11 LAB — FERRITIN: Ferritin: 103 ng/mL (ref 15–150)

## 2021-06-12 ENCOUNTER — Encounter: Payer: Self-pay | Admitting: Family Medicine

## 2021-06-13 ENCOUNTER — Other Ambulatory Visit: Payer: Medicaid Other

## 2021-06-13 LAB — CYTOLOGY - PAP
Chlamydia: NEGATIVE
Comment: NEGATIVE
Comment: NEGATIVE
Comment: NEGATIVE
Comment: NORMAL
High risk HPV: NEGATIVE
Neisseria Gonorrhea: NEGATIVE
Trichomonas: NEGATIVE

## 2021-06-18 ENCOUNTER — Ambulatory Visit: Admission: RE | Admit: 2021-06-18 | Payer: Medicaid Other | Source: Ambulatory Visit

## 2021-06-19 ENCOUNTER — Ambulatory Visit
Admission: RE | Admit: 2021-06-19 | Discharge: 2021-06-19 | Disposition: A | Discharge status: Home / Self Care | Payer: Medicaid Other | Source: Ambulatory Visit | Attending: Family Medicine | Admitting: Family Medicine

## 2021-06-19 DIAGNOSIS — R102 Pelvic and perineal pain: Secondary | ICD-10-CM | POA: Diagnosis not present

## 2021-06-20 ENCOUNTER — Ambulatory Visit (INDEPENDENT_AMBULATORY_CARE_PROVIDER_SITE_OTHER): Payer: Medicaid Other | Admitting: Physician Assistant

## 2021-06-20 ENCOUNTER — Ambulatory Visit (INDEPENDENT_AMBULATORY_CARE_PROVIDER_SITE_OTHER): Payer: Medicaid Other

## 2021-06-20 ENCOUNTER — Encounter: Payer: Self-pay | Admitting: Physician Assistant

## 2021-06-20 DIAGNOSIS — M5440 Lumbago with sciatica, unspecified side: Secondary | ICD-10-CM

## 2021-06-20 MED ORDER — MELOXICAM 15 MG PO TABS: 15.0000 mg | ORAL_TABLET | Freq: Every day | ORAL | 0 refills | Status: DC
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
## 2021-07-01 ENCOUNTER — Ambulatory Visit (INDEPENDENT_AMBULATORY_CARE_PROVIDER_SITE_OTHER): Payer: Medicaid Other | Admitting: Obstetrics and Gynecology

## 2021-07-01 ENCOUNTER — Encounter: Payer: Self-pay | Admitting: Obstetrics and Gynecology

## 2021-07-01 ENCOUNTER — Other Ambulatory Visit (HOSPITAL_COMMUNITY)
Admission: RE | Admit: 2021-07-01 | Discharge: 2021-07-01 | Disposition: A | Discharge status: Home / Self Care | Payer: Medicaid Other | Source: Ambulatory Visit | Attending: Obstetrics and Gynecology | Admitting: Obstetrics and Gynecology

## 2021-07-01 VITALS — BP 109/71 | HR 87 | Wt 157.0 lb

## 2021-07-01 DIAGNOSIS — R87619 Unspecified abnormal cytological findings in specimens from cervix uteri: Secondary | ICD-10-CM | POA: Insufficient documentation

## 2021-07-01 DIAGNOSIS — Z712 Person consulting for explanation of examination or test findings: Secondary | ICD-10-CM

## 2021-07-01 DIAGNOSIS — R87618 Other abnormal cytological findings on specimens from cervix uteri: Secondary | ICD-10-CM | POA: Diagnosis not present

## 2021-07-01 NOTE — Progress Notes (Signed)
51 y.o GYN presents for COLPO, ENDO Bx and [place] results. ? ?UPT today is Negative  ?

## 2021-07-01 NOTE — Progress Notes (Signed)
Patient with AGUS pap smear 05/2021 here for colposcopy. Reviewed normal ultrasound with the patient ? ?US PELVIC COMPLETE WITH TRANSVAGINAL ? ?Result Date: 06/19/2021 ?CLINICAL DATA:  Pelvic pain EXAM: TRANSABDOMINAL AND TRANSVAGINAL ULTRASOUND OF PELVIS TECHNIQUE: Both transabdominal and transvaginal ultrasound examinations of the pelvis were performed. Transabdominal technique was performed for global imaging of the pelvis including uterus, ovaries, adnexal regions, and pelvic cul-de-sac. It was necessary to proceed with endovaginal exam following the transabdominal exam to visualize the adnexal structures and endometrium. COMPARISON:  None Available. FINDINGS: Uterus Measurements: 8.6 x 5.5 by 5.8 cm = volume: 142.1 mL. No fibroids or other mass visualized. Endometrium Thickness: 8 mm.  No focal abnormality visualized. Right ovary Measurements: 4.2 x 2.4 by 3.1 cm = volume: 16.2 mL. Normal follicles. No adnexal masses. Left ovary Measurements: 3.4 x 1.3 x 2.2 cm = volume: 5.0 mL. Normal follicles. No adnexal masses. Other findings Trace pelvic free fluid. IMPRESSION: 1. Age-appropriate pelvic ultrasound. Trace pelvic free fluid is likely physiologic. Electronically Signed   By: Sharlet Salina M.D.   On: 06/19/2021 23:36  ? ?XR Lumbar Spine 2-3 Views ? ?Result Date: 06/20/2021 ?Radiographs of her lumbar spine demonstrate no listhesis.  No evidence of acute fracture.  Overall well-maintained joint spaces may be some slight narrowing at L5-S1 and L4-5  ? ? ?Patient given informed consent, signed copy in the chart, time out was performed.  Placed in lithotomy position. Cervix viewed with speculum and colposcope after application of acetic acid.  ? ?Colposcopy adequate?  yes ?Acetowhite lesions? no ?Punctation?no ?Mosaicism?  no ?Abnormal vasculature?  no ?Biopsies?no ?ECC?yes ? ?ENDOMETRIAL BIOPSY     ?The indications for endometrial biopsy were reviewed.   Risks of the biopsy including cramping, bleeding, infection,  uterine perforation, inadequate specimen and need for additional procedures  were discussed. The patient states she understands and agrees to undergo procedure today. Consent was signed. Time out was performed. Urine HCG was negative. ?A sterile speculum was placed in the patient's vagina and the cervix was prepped with Betadine. A single-toothed tenaculum was placed on the anterior lip of the cervix to stabilize it. The uterine cavity was sounded to a depth of 8 cm using the uterine sound. The 3 mm pipelle was introduced into the endometrial cavity without difficulty, 2 passes were made.  A  moderate amount of tissue was  sent to pathology. The instruments were removed from the patient's vagina. Minimal bleeding from the cervix was noted. The patient tolerated the procedure well.  Routine post-procedure instructions were given to the patient.  ? ? ?COMMENTS: ?Patient was given post procedure instructions.  The patient will follow up in two weeks to review the results and for further management.  ? ?Catalina Antigua, MD ? ?

## 2021-07-02 LAB — SURGICAL PATHOLOGY

## 2021-07-04 NOTE — Progress Notes (Signed)
TC using Arabic interpreter. Pt informed of normal results. Pt verbalized understanding and given opportunity to ask questions. No questions voiced.

## 2021-07-08 ENCOUNTER — Encounter: Payer: Self-pay | Admitting: Physical Therapy

## 2021-07-08 ENCOUNTER — Ambulatory Visit: Payer: Medicaid Other | Attending: Physician Assistant | Admitting: Physical Therapy

## 2021-07-08 DIAGNOSIS — M5440 Lumbago with sciatica, unspecified side: Secondary | ICD-10-CM | POA: Diagnosis not present

## 2021-07-08 DIAGNOSIS — M6281 Muscle weakness (generalized): Secondary | ICD-10-CM

## 2021-07-08 DIAGNOSIS — M5416 Radiculopathy, lumbar region: Secondary | ICD-10-CM | POA: Diagnosis not present
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
## 2021-07-22 ENCOUNTER — Ambulatory Visit (INDEPENDENT_AMBULATORY_CARE_PROVIDER_SITE_OTHER): Payer: Medicaid Other | Admitting: Physician Assistant

## 2021-07-22 ENCOUNTER — Encounter: Payer: Self-pay | Admitting: Physician Assistant

## 2021-07-22 DIAGNOSIS — M5440 Lumbago with sciatica, unspecified side: Secondary | ICD-10-CM

## 2021-07-22 DIAGNOSIS — G8929 Other chronic pain: Secondary | ICD-10-CM | POA: Insufficient documentation

## 2021-07-22 DIAGNOSIS — M545 Low back pain, unspecified: Secondary | ICD-10-CM | POA: Insufficient documentation

## 2021-07-22 NOTE — Progress Notes (Signed)
Office Visit Note   Patient: Stacy Conner           Date of Birth: 1970/04/03           MRN: [id number] Visit Date: 07/22/2021              Requested by: Orion Crook I, NP [address] [place],  [place] [address] PCP: Kathrynn Speed, NP  Chief Complaint  Patient presents with   Lower Back - Follow-up      HPI: Patient is a pleasant 51 year old woman who is here with an interpreter.  She presents for follow-up on her low back pain.  This radiates into her right buttock.  Denies any paresthesias.  She says sometimes she is better sometimes she gets worse.  She definitely is improved with the pain is increased when she sitting a long period of time.  At her last visit we recommended physical therapy and anti-inflammatories.  She is only had 1 physical therapy session as of today but has several more scheduled.  Denies any worsening of her symptoms.  Assessment & Plan: Visit Diagnoses:  1. Acute right-sided low back pain with sciatica, sciatica laterality unspecified     Plan: I had a discussion with the patient today I would like her to do a month of physical therapy.  She did have MRIs and other studies done back in [place].  She offered to bring these but I think if she is not better in a month I would recommend a new MRI  Follow-Up Instructions: Return in about 1 month (around 08/21/2021).   Ortho Exam  Patient is alert, oriented, no adenopathy, well-dressed, normal affect, normal respiratory effort. Examination she did have some stiffness when arising.  She has 5 out of 5 strength with dorsiflexion plantarflexion of her foot and ankle.  Extension flexion of her leg.  Pain is focused in the right posterior buttock  Imaging: No results found. No images are attached to the encounter.  Labs: Lab Results  Component Value Date   HGBA1C 5.5 12/21/2020     Lab Results  Component Value Date   ALBUMIN 4.3 11/30/2020    No results found for: MG Lab  Results  Component Value Date   VD25OH 13.3 (L) 11/30/2020    No results found for: PREALBUMIN    Latest Ref Rng & Units 06/10/2021    9:50 AM 11/30/2020    2:55 PM  CBC EXTENDED  WBC 3.4 - 10.8 x10E3/uL 6.0   6.3    RBC 3.77 - 5.28 x10E6/uL 4.34   4.46    Hemoglobin 11.1 - 15.9 g/dL 70.6   23.7    HCT 62.8 - 46.6 % 39.6   39.1    Platelets 150 - 450 x10E3/uL 277   285    NEUT# 1.4 - 7.0 x10E3/uL  2.3    Lymph# 0.7 - 3.1 x10E3/uL  2.5       There is no height or weight on file to calculate BMI.  Orders:  No orders of the defined types were placed in this encounter.  No orders of the defined types were placed in this encounter.    Procedures: No procedures performed  Clinical Data: No additional findings.  ROS:  All other systems negative, except as noted in the HPI. Review of Systems  Objective: Vital Signs: There were no vitals taken for this visit.  Specialty Comments:  No specialty comments available.  PMFS History: Patient Active Problem  List   Diagnosis Date Noted   Low back pain 07/22/2021   History reviewed. No pertinent past medical history.  Family History  Family history unknown: Yes    Past Surgical History:  Procedure Laterality Date   NO PAST SURGERIES     Social History   Occupational History   Not on file  Tobacco Use   Smoking status: Never   Smokeless tobacco: Never  Vaping Use   Vaping Use: Never used  Substance and Sexual Activity   Alcohol use: Never   Drug use: Never   Sexual activity: Yes    Birth control/protection: None

## 2021-07-23 ENCOUNTER — Other Ambulatory Visit: Payer: Self-pay | Admitting: Physician Assistant

## 2021-07-24 ENCOUNTER — Encounter: Payer: Self-pay | Admitting: Physical Therapy

## 2021-07-24 ENCOUNTER — Ambulatory Visit: Payer: Medicaid Other | Attending: Physician Assistant | Admitting: Physical Therapy

## 2021-07-24 DIAGNOSIS — M6281 Muscle weakness (generalized): Secondary | ICD-10-CM | POA: Diagnosis present

## 2021-07-24 DIAGNOSIS — M5416 Radiculopathy, lumbar region: Secondary | ICD-10-CM | POA: Diagnosis present
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
## 2021-07-25 NOTE — Therapy (Signed)
OUTPATIENT PHYSICAL THERAPY TREATMENT NOTE   Patient Name: Stacy Conner MRN: [id number] DOB:March 17, 1970, 51 y.o., female Today's Date: 07/26/2021  PCP: Orion Crook, NP   REFERRING PROVIDER: Corrie Dandy Persons, PA    END OF SESSION:   PT End of Session - 07/26/21 0924     Visit Number 3    Number of Visits 12    Date for PT Re-Evaluation 08/19/21    Authorization Type [place] [employer] - 12 visits    PT Start Time 0930    PT Stop Time 1015    PT Time Calculation (min) 45 min    Activity Tolerance Patient limited by pain    Behavior During Therapy [facility] for tasks assessed/performed              No past medical history on file. Past Surgical History:  Procedure Laterality Date   NO PAST SURGERIES     Patient Active Problem List   Diagnosis Date Noted   Low back pain 07/22/2021    REFERRING DIAG:    Low back pain with sciatica, sciatica laterality unspecified, unspecified back pain laterality, unspecified chronicity     THERAPY DIAG:  Radiculopathy, lumbar region  Muscle weakness (generalized)  Rationale for Evaluation and Treatment Rehabilitation  PRECAUTIONS: None   SUBJECTIVE: It feels better in the muscle. Has not taken meds, no longer has any.  Time spent explaining pain scale. She tried the prone exercise but it was hard to walk after that .    PAIN:  Are you having pain? Yes: NPRS scale: 9/10 Pain location: low back, Rt LE from hip to foot/toes.  Pain description: sharp, severe , burning  Aggravating factors: bending forward, lifting  Relieving factors: medication, massage   OBJECTIVE: (objective measures completed at initial evaluation unless otherwise dated)   DIAGNOSTIC FINDINGS:  None at this time    PATIENT SURVEYS:  NT due to language barrier   SCREENING FOR RED FLAGS: Bowel or bladder incontinence: No Spinal tumors: No Cauda equina syndrome: No Compression fracture: No Abdominal aneurysm: No   COGNITION:            Overall cognitive status: Within functional limits for tasks assessed                          SENSATION: LLE numb and tingling to foot      POSTURE: rounded shoulders, forward head, flexed trunk , and decr WB on Rt    PALPATION: Not tolerated to bilateral posterior legs , lumbar   LUMBAR ROM:    Active  A/PROM  eval  Flexion 50% pain   Extension 75% pain   Right lateral flexion    Left lateral flexion    Right rotation 50%  Left rotation 50%   (Blank rows = not tested)   LOWER EXTREMITY ROM:      Passive  Right eval Left eval  Hip flexion < 90  < 90  Hip extension      Hip abduction      Hip adduction      Hip internal rotation Limited, <20 Limited,  < 20  Hip external rotation 30 30  Knee flexion      Knee extension      Ankle dorsiflexion      Ankle plantarflexion      Ankle inversion      Ankle eversion       (Blank rows = not tested)  LOWER EXTREMITY MMT:     MMT Right eval Left eval  Hip flexion 4 4  Hip extension      Hip abduction      Hip adduction      Hip internal rotation      Hip external rotation      Knee flexion 4- 4-  Knee extension 4- 4-  Ankle dorsiflexion 4- 4-  Ankle plantarflexion      Ankle inversion      Ankle eversion       (Blank rows = not tested)   LUMBAR SPECIAL TESTS:  Straight leg raise test: Positive and Slump test: Positive     GAIT: Distance walked: 75 Assistive device utilized:  none Level of assistance: Modified independence Comments: slow, mild limp        TODAY'S TREATMENT   [facility] Adult PT Treatment:                                                DATE: 07/26/21 Therapeutic Exercise: Spine flexion vs extension, assessing pain Wall for spine extension, leaning hips to wall x 5 Prone with 1 pillow , breathing with lower abdominal draw it.  Prone knee flexion x 10 alternating Prone hip ext. X 10 alt.  Childs pose x 5 rocking  Mini bridge x 10  Lower trunk rotation x 10  Manual Therapy: LAD brief Rt  LE x 5 Self Care: Body mechanics , bed mobility , donning shoes   [facility] Adult PT Treatment:                                                DATE: 07/24/21 Therapeutic Exercise: LTR PPT- increased pain Mini bridge -well tolerated Hooklying: Hamstring stretch with ankle pumps Hooklying clam red band  Prone positioning over 2 pillows -after 5 minutes back and leg pain resolved Increased pain with transition out of prone   PATIENT EDUCATION:  Education details:pain scale, extension, body mechanics to reduce strain  Person educated: Patient Education method: Programmer, multimediaxplanation, Facilities managerDemonstration, Verbal cues, and Handouts Education comprehension: verbalized understanding, returned demonstration, and needs further education     HOME EXERCISE PROGRAM: Access Code: [email or web address] URL: [email or web address] Date: 07/26/2021 Prepared by: Karie MainlandJennifer Lera Gaines  Exercises - Supine with Legs Supported at 90/90  - 1-2 x daily - 7 x weekly - 1 sets - 1 reps - 15 min with ice hold - Standing Lumbar Extension at Wall  - 3 x daily - 7 x weekly - 1 sets - 10 reps - 10 hold - Sidelying Diaphragmatic Breathing  - 3 x daily - 7 x weekly - 1 sets - 10 reps - 30 hold - Supine Lower Trunk Rotation  - 1 x daily - 7 x weekly - 1 sets - 10 reps - 5 hold - Bridge  - 1 x daily - 7 x weekly - 2 sets - 10 reps - Lying Prone with 2 Pillows  - 1 x daily - 7 x weekly - 1 sets - 2-5 hold - Prone Hip Extension with Pillow Under Abdomen  - 1 x daily - 7 x weekly - 2 sets - 10 reps - 5 hold - Child's Pose Stretch  - 1 x daily -  7 x weekly - 1 sets - 10 reps - 30 hold  Patient Education - Rolling From Your Back to Your Side  ASSESSMENT:   CLINICAL IMPRESSION: Patient cont to be in severe pain by subjective report.  She has relief of pain with prone and 1 pillow to 5/10.  She has poor movement patterns that seem to increase her pain . In depth explanation of this concept.  She needed increased time due to interpretation and novel  idea. She will pause the sanding extension for now.       OBJECTIVE IMPAIRMENTS decreased activity tolerance, decreased endurance, decreased mobility, difficulty walking, decreased ROM, decreased strength, hypomobility, increased fascial restrictions, impaired flexibility, impaired sensation, improper body mechanics, and pain.    ACTIVITY LIMITATIONS carrying, lifting, bending, sitting, standing, squatting, sleeping, stairs, transfers, bed mobility, dressing, reach over head, and caring for others   PARTICIPATION LIMITATIONS: meal prep, cleaning, laundry, interpersonal relationship, shopping, and community activity   PERSONAL FACTORS Past/current experiences and 1 comorbidity: chronic back pain   are also affecting patient's functional outcome, as well as being newcomer to [place].   REHAB POTENTIAL: Good   CLINICAL DECISION MAKING: Stable/uncomplicated   EVALUATION COMPLEXITY: Low     GOALS:   LONG TERM GOALS: Target date: 08/19/2021   Pt will report centralization of pain from LEs for 50% of the time. Baseline: Rt LE pain , LLE numb, tingling  Goal status: INITIAL   2.  Pt will be able to lift and carry her child with pain < 5/10 most of the time in her low back  Baseline: pain severe, needs cues to lift and use proper mechanics Goal status: INITIAL   3.  Pt will be able stand for light housework (dishes, cooking) with pain  < 5/10 for 15 min or more  Baseline: severe, < 10 min  Goal status: INITIAL   4.  Pt will improve LE strength to 4+/5 or more in LEs  Baseline: 4-/5 Goal status: INITIAL   5.  Pt will be I with HEP for long term pain mgmt (core, LE, trunk)  Baseline: given on eval  Goal status: INITIAL       PLAN: PT FREQUENCY: 2x/week   PT DURATION: 6 weeks   PLANNED INTERVENTIONS: Therapeutic exercises, Therapeutic activity, Neuromuscular re-education, Balance training, Gait training, Patient/Family education, Joint mobilization, Dry Needling, Electrical stimulation,  Spinal mobilization, Cryotherapy, Moist heat, Taping, Manual therapy, and Re-evaluation.   PLAN FOR NEXT SESSION:  Manual. Positioning.  Try quadruped?     Karie Mainland, PT 07/26/21 10:16 AM Phone: [phone] Fax: [phone]

## 2021-07-26 ENCOUNTER — Ambulatory Visit: Payer: Medicaid Other | Admitting: Physical Therapy

## 2021-07-26 DIAGNOSIS — M5416 Radiculopathy, lumbar region: Secondary | ICD-10-CM

## 2021-07-26 DIAGNOSIS — M6281 Muscle weakness (generalized): Secondary | ICD-10-CM

## 2021-07-30 NOTE — Therapy (Unsigned)
OUTPATIENT PHYSICAL THERAPY TREATMENT NOTE   Patient Name: Stacy Conner MRN: [id number] DOB:03/07/1970, 51 y.o., female Today's Date: 07/31/2021  PCP: Orion CrookPassmore, [place], NP   REFERRING PROVIDER: Corrie DandyMary Persons, PA    END OF SESSION:   PT End of Session - 07/31/21 0923     Visit Number 4    Number of Visits 12    Date for PT Re-Evaluation 08/19/21    Authorization Type [place] Medicaid [employer] - 12 visits    PT Start Time 0930    PT Stop Time 1015    PT Time Calculation (min) 45 min    Activity Tolerance Patient limited by pain    Behavior [facility] tasks assessed/performed               History reviewed. No pertinent past medical history. Past Surgical History:  Procedure Laterality Date   NO PAST SURGERIES     Patient Active Problem List   Diagnosis Date Noted   Low back pain 07/22/2021    REFERRING DIAG:    Low back pain with sciatica, sciatica laterality unspecified, unspecified back pain laterality, unspecified chronicity     THERAPY DIAG:  Radiculopathy, lumbar region  Muscle weakness (generalized)  Rationale for Evaluation and Treatment Rehabilitation  PRECAUTIONS: None   SUBJECTIVE: Patient was pain 8/10 today in Rt lumbar.  Less pain in Rt LE now.  She brings in her MRI from 2020 showing L4-L5 postero-foramenal disc bulge.  See scanned report.   Hardest things to do at home because of back pain include sitting, lifting and sometimes climbing stairs.   PAIN:  Are you having pain? Yes: NPRS scale: 9/10 Pain location: low back, Rt LE from hip to foot/toes.  Pain description: sharp, severe , burning  Aggravating factors: bending forward, lifting  Relieving factors: medication, massage   OBJECTIVE: (objective measures completed at initial evaluation unless otherwise dated)   DIAGNOSTIC FINDINGS:  None at this time    PATIENT SURVEYS:  NT due to language barrier   SCREENING FOR RED FLAGS: Bowel or bladder incontinence:  No Spinal tumors: No Cauda equina syndrome: No Compression fracture: No Abdominal aneurysm: No   COGNITION:           Overall cognitive status: Within functional limits for tasks assessed                          SENSATION: LLE numb and tingling to foot      POSTURE: rounded shoulders, forward head, flexed trunk , and decr WB on Rt    PALPATION: Not tolerated to bilateral posterior legs , lumbar   LUMBAR ROM:    Active  A/PROM  eval  Flexion 50% pain   Extension 75% pain   Right lateral flexion    Left lateral flexion    Right rotation 50%  Left rotation 50%   (Blank rows = not tested)   LOWER EXTREMITY ROM:      Passive  Right eval Left eval  Hip flexion < 90  < 90  Hip extension      Hip abduction      Hip adduction      Hip internal rotation Limited, <20 Limited,  < 20  Hip external rotation 30 30  Knee flexion      Knee extension      Ankle dorsiflexion      Ankle plantarflexion      Ankle inversion  Ankle eversion       (Blank rows = not tested)   LOWER EXTREMITY MMT:     MMT Right eval Left eval  Hip flexion 4 4  Hip extension      Hip abduction      Hip adduction      Hip internal rotation      Hip external rotation      Knee flexion 4- 4-  Knee extension 4- 4-  Ankle dorsiflexion 4- 4-  Ankle plantarflexion      Ankle inversion      Ankle eversion       (Blank rows = not tested)   LUMBAR SPECIAL TESTS:  Straight leg raise test: Positive and Slump test: Positive     GAIT: Distance walked: 75 Assistive device utilized:  none Level of assistance: Modified independence Comments: slow, mild limp        TODAY'S TREATMENT    [facility] Adult PT Treatment:                                                DATE: 07/31/21 Therapeutic Exercise: Supine hamstring stretch 30 sec x 3 each side  Lower trunk rotation x 5 Prone hip extension x 10 each  Prone on elbows prop x 1 min  Scapular press up x 5  Hands on mat , full press up x 10 sec  x 5  UE/LE "swimming"  x 5  Cat and camel x 5  Childs pose x 3 Bird dog x 5  Sit to stand x 10 x 2 sets,added 10 lbs 2nd set Squat x 10 with 10 lbs x 1 Self Care: Spine anatomy, extension vs flexion , disc, review MRI report  [facility] Adult PT Treatment:                                                DATE: 07/26/21 Therapeutic Exercise: Spine flexion vs extension, assessing pain Wall for spine extension, leaning hips to wall x 5 Prone with 1 pillow , breathing with lower abdominal draw it.  Prone knee flexion x 10 alternating Prone hip ext. X 10 alt.  Childs pose x 5 rocking  Mini bridge x 10  Lower trunk rotation x 10  Manual Therapy: LAD brief Rt LE x 5 Self Care: Body mechanics , bed mobility , donning shoes   [facility] Adult PT Treatment:                                                DATE: 07/24/21 Therapeutic Exercise: LTR PPT- increased pain Mini bridge -well tolerated Hooklying: Hamstring stretch with ankle pumps Hooklying clam red band  Prone positioning over 2 pillows -after 5 minutes back and leg pain resolved Increased pain with transition out of prone   PATIENT EDUCATION:  Education details:pain scale, extension, body mechanics to reduce strain  Person educated: Patient Education method: Programmer, multimedia, Facilities manager, Verbal cues, and Handouts Education comprehension: verbalized understanding, returned demonstration, and needs further education     HOME EXERCISE PROGRAM: Access Code: [email or web address] URL: [email or web address] Date: 07/26/2021 Prepared by: Victorino Dike  Kylon Philbrook  Exercises - Supine with Legs Supported at 90/90  - 1-2 x daily - 7 x weekly - 1 sets - 1 reps - 15 min with ice hold - Standing Lumbar Extension at Wall  - 3 x daily - 7 x weekly - 1 sets - 10 reps - 10 hold - Sidelying Diaphragmatic Breathing  - 3 x daily - 7 x weekly - 1 sets - 10 reps - 30 hold - Supine Lower Trunk Rotation  - 1 x daily - 7 x weekly - 1 sets - 10 reps - 5 hold - Bridge  - 1 x  daily - 7 x weekly - 2 sets - 10 reps - Lying Prone with 2 Pillows  - 1 x daily - 7 x weekly - 1 sets - 2-5 hold - Prone Hip Extension with Pillow Under Abdomen  - 1 x daily - 7 x weekly - 2 sets - 10 reps - 5 hold - Child's Pose Stretch  - 1 x daily - 7 x weekly - 1 sets - 10 reps - 30 hold  Patient Education - Rolling From Your Back to Your Side  ASSESSMENT:   CLINICAL IMPRESSION: Patient was able to perform transfers and bed mobility with good body mechanics. She is showing a centralization of her pain to her Rt lumbar.  She has some days with less than others.  She was able to understand directions related to movement patterns better than last visit.  Pain 7/10 post session per subjective report.     OBJECTIVE IMPAIRMENTS decreased activity tolerance, decreased endurance, decreased mobility, difficulty walking, decreased ROM, decreased strength, hypomobility, increased fascial restrictions, impaired flexibility, impaired sensation, improper body mechanics, and pain.    ACTIVITY LIMITATIONS carrying, lifting, bending, sitting, standing, squatting, sleeping, stairs, transfers, bed mobility, dressing, reach over head, and caring for others   PARTICIPATION LIMITATIONS: meal prep, cleaning, laundry, interpersonal relationship, shopping, and community activity   PERSONAL FACTORS Past/current experiences and 1 comorbidity: chronic back pain   are also affecting patient's functional outcome, as well as being newcomer to [place].   REHAB POTENTIAL: Good   CLINICAL DECISION MAKING: Stable/uncomplicated   EVALUATION COMPLEXITY: Low     GOALS:   LONG TERM GOALS: Target date: 08/19/2021   Pt will report centralization of pain from LEs for 50% of the time. Baseline: Rt LE pain , LLE numb, tingling  Goal status: improving, very light pain in Rt LE and none in LLE   2.  Pt will be able to lift and carry her child with pain < 5/10 most of the time in her low back  Baseline: pain severe, needs cues  to lift and use proper mechanics Goal status: in progress   3.  Pt will be able stand for light housework (dishes, cooking) with pain  < 5/10 for 15 min or more  Baseline: severe, < 10 min  Goal status: in progress    4.  Pt will improve LE strength to 4+/5 or more in LEs  Baseline: 4-/5 Goal status: ongoing    5.  Pt will be I with HEP for long term pain mgmt (core, LE, trunk)  Baseline: given on eval  Goal status: ongoing        PLAN: PT FREQUENCY: 2x/week   PT DURATION: 6 weeks   PLANNED INTERVENTIONS: Therapeutic exercises, Therapeutic activity, Neuromuscular re-education, Balance training, Gait training, Patient/Family education, Joint mobilization, Dry Needling, Electrical stimulation, Spinal mobilization, Cryotherapy, Moist heat, Taping, Manual therapy, and  Re-evaluation.   PLAN FOR NEXT SESSION:  Manual. Stabilization extension based is working for her. Positioning.  Try quadruped?     Karie Mainland, PT 07/31/21 10:14 AM Phone: [phone] Fax: [phone]

## 2021-07-31 ENCOUNTER — Ambulatory Visit: Payer: Medicaid Other | Admitting: Physical Therapy

## 2021-07-31 ENCOUNTER — Encounter: Payer: Self-pay | Admitting: Physical Therapy

## 2021-07-31 DIAGNOSIS — M5416 Radiculopathy, lumbar region: Secondary | ICD-10-CM | POA: Diagnosis not present

## 2021-07-31 DIAGNOSIS — M6281 Muscle weakness (generalized): Secondary | ICD-10-CM

## 2021-08-01 NOTE — Therapy (Signed)
OUTPATIENT PHYSICAL THERAPY TREATMENT NOTE   Patient Name: Stacy Conner MRN: [id number] DOB:February 13, 1971, 51 y.o., female Today's Date: 08/02/2021  PCP: Orion Crook, NP   REFERRING PROVIDER: Corrie Dandy Persons, PA    END OF SESSION:   PT End of Session - 08/02/21 0937     Visit Number 5    Number of Visits 12    Date for PT Re-Evaluation 08/19/21    Authorization Type [place] Medicaid [employer] - 12 visits    PT Start Time 0933    PT Stop Time 1015    PT Time Calculation (min) 42 min    Activity Tolerance Patient limited by pain    Behavior During Therapy [facility] for tasks assessed/performed                No past medical history on file. Past Surgical History:  Procedure Laterality Date   NO PAST SURGERIES     Patient Active Problem List   Diagnosis Date Noted   Low back pain 07/22/2021    REFERRING DIAG:    Low back pain with sciatica, sciatica laterality unspecified, unspecified back pain laterality, unspecified chronicity     THERAPY DIAG:  Radiculopathy, lumbar region  Muscle weakness (generalized)  Rationale for Evaluation and Treatment Rehabilitation  PRECAUTIONS: None   SUBJECTIVE: It is light pain today .  PAIN:  Are you having pain? Yes: NPRS scale: not rated. /10 Pain location: low back, Rt LE from hip to foot/toes.  Pain description: sharp, severe , burning  Aggravating factors: bending forward, lifting  Relieving factors: medication, massage   OBJECTIVE: (objective measures completed at initial evaluation unless otherwise dated)   DIAGNOSTIC FINDINGS:  None at this time    PATIENT SURVEYS:  NT due to language barrier   SCREENING FOR RED FLAGS: Bowel or bladder incontinence: No Spinal tumors: No Cauda equina syndrome: No Compression fracture: No Abdominal aneurysm: No   COGNITION:           Overall cognitive status: Within functional limits for tasks assessed                          SENSATION: LLE numb and tingling to  foot      POSTURE: rounded shoulders, forward head, flexed trunk , and decr WB on Rt    PALPATION: Not tolerated to bilateral posterior legs , lumbar   LUMBAR ROM:    Active  A/PROM  eval  Flexion 50% pain   Extension 75% pain   Right lateral flexion    Left lateral flexion    Right rotation 50%  Left rotation 50%   (Blank rows = not tested)   LOWER EXTREMITY ROM:      Passive  Right eval Left eval  Hip flexion < 90  < 90  Hip extension      Hip abduction      Hip adduction      Hip internal rotation Limited, <20 Limited,  < 20  Hip external rotation 30 30  Knee flexion      Knee extension      Ankle dorsiflexion      Ankle plantarflexion      Ankle inversion      Ankle eversion       (Blank rows = not tested)   LOWER EXTREMITY MMT:     MMT Right eval Left eval  Hip flexion 4 4  Hip extension      Hip  abduction      Hip adduction      Hip internal rotation      Hip external rotation      Knee flexion 4- 4-  Knee extension 4- 4-  Ankle dorsiflexion 4- 4-  Ankle plantarflexion      Ankle inversion      Ankle eversion       (Blank rows = not tested)   LUMBAR SPECIAL TESTS:  Straight leg raise test: Positive and Slump test: Positive     GAIT: Distance walked: 75 Assistive device utilized:  none Level of assistance: Modified independence Comments: slow, mild limp        TODAY'S TREATMENT   [facility] Adult PT Treatment:                                                DATE: 08/02/21 Therapeutic Exercise: Nustep 6 min UE and LE level 5  Bilateral UE Wall slides x 10 trunk extension Wall slides for LE strength  x 10 Iso 1/2 squat with horizontal abd red band x 10  Diagonal pull red band x 10 , mod cues needed Quadruped stretching cat and cow to childs pose  Bird dog x 5 , 5 sec hold Hip extension x 10 each side, mod cues to avoid hyperextension   Supine lower trunk rotation x 5 wide knees Supine 90/90 for core comprehension then into stability Alt  knee lift x 10 from table  SLR with core focus x 10  Bridge x 10   [facility] Adult PT Treatment:                                                DATE: 07/31/21 Therapeutic Exercise: Supine hamstring stretch 30 sec x 3 each side  Lower trunk rotation x 5 Prone hip extension x 10 each  Prone on elbows prop x 1 min  Scapular press up x 5  Hands on mat , full press up x 10 sec x 5  UE/LE "swimming"  x 5  Cat and camel x 5  Childs pose x 3 Bird dog x 5  Sit to stand x 10 x 2 sets,added 10 lbs 2nd set Squat x 10 with 10 lbs x 1 Self Care: Spine anatomy, extension vs flexion , disc, review MRI report  [facility] Adult PT Treatment:                                                DATE: 07/26/21 Therapeutic Exercise: Spine flexion vs extension, assessing pain Wall for spine extension, leaning hips to wall x 5 Prone with 1 pillow , breathing with lower abdominal draw it.  Prone knee flexion x 10 alternating Prone hip ext. X 10 alt.  Childs pose x 5 rocking  Mini bridge x 10  Lower trunk rotation x 10  Manual Therapy: LAD brief Rt LE x 5 Self Care: Body mechanics , bed mobility , donning shoes   [facility] Adult PT Treatment:  DATE: 07/24/21 Therapeutic Exercise: LTR PPT- increased pain Mini bridge -well tolerated Hooklying: Hamstring stretch with ankle pumps Hooklying clam red band  Prone positioning over 2 pillows -after 5 minutes back and leg pain resolved Increased pain with transition out of prone   PATIENT EDUCATION:  Education details:pain scale, extension, body mechanics to reduce strain  Person educated: Patient Education method: Programmer, multimedia, Facilities manager, Verbal cues, and Handouts Education comprehension: verbalized understanding, returned demonstration, and needs further education     HOME EXERCISE PROGRAM: Access Code: [email or web address] URL: [email or web address] Date: 07/26/2021 Prepared by: Karie Mainland  Exercises - Supine  with Legs Supported at 90/90  - 1-2 x daily - 7 x weekly - 1 sets - 1 reps - 15 min with ice hold - Standing Lumbar Extension at Wall  - 3 x daily - 7 x weekly - 1 sets - 10 reps - 10 hold - Sidelying Diaphragmatic Breathing  - 3 x daily - 7 x weekly - 1 sets - 10 reps - 30 hold - Supine Lower Trunk Rotation  - 1 x daily - 7 x weekly - 1 sets - 10 reps - 5 hold - Bridge  - 1 x daily - 7 x weekly - 2 sets - 10 reps - Lying Prone with 2 Pillows  - 1 x daily - 7 x weekly - 1 sets - 2-5 hold - Prone Hip Extension with Pillow Under Abdomen  - 1 x daily - 7 x weekly - 2 sets - 10 reps - 5 hold - Child's Pose Stretch  - 1 x daily - 7 x weekly - 1 sets - 10 reps - 30 hold  Patient Education - Rolling From Your Back to Your Side  ASSESSMENT:   CLINICAL IMPRESSION: Added standing challenge to her session today for core exercises.  She has a tendency to hyperextend spine with hip extension.  Focus on core today and she tolerated it well.  Rt LE weaker with SLR.     OBJECTIVE IMPAIRMENTS decreased activity tolerance, decreased endurance, decreased mobility, difficulty walking, decreased ROM, decreased strength, hypomobility, increased fascial restrictions, impaired flexibility, impaired sensation, improper body mechanics, and pain.    ACTIVITY LIMITATIONS carrying, lifting, bending, sitting, standing, squatting, sleeping, stairs, transfers, bed mobility, dressing, reach over head, and caring for others   PARTICIPATION LIMITATIONS: meal prep, cleaning, laundry, interpersonal relationship, shopping, and community activity   PERSONAL FACTORS Past/current experiences and 1 comorbidity: chronic back pain   are also affecting patient's functional outcome, as well as being newcomer to [place].   REHAB POTENTIAL: Good   CLINICAL DECISION MAKING: Stable/uncomplicated   EVALUATION COMPLEXITY: Low     GOALS:   LONG TERM GOALS: Target date: 08/19/2021   Pt will report centralization of pain from LEs for 50% of  the time. Baseline: Rt LE pain , LLE numb, tingling  Goal status: improving, very light pain in Rt LE and none in LLE   2.  Pt will be able to lift and carry her child with pain < 5/10 most of the time in her low back  Baseline: pain severe, needs cues to lift and use proper mechanics Goal status: in progress   3.  Pt will be able stand for light housework (dishes, cooking) with pain  < 5/10 for 15 min or more  Baseline: severe, < 10 min  Goal status: in progress    4.  Pt will improve LE strength to 4+/5 or more in LEs  Baseline: 4-/5 Goal status: ongoing  5.  Pt will be I with HEP for long term pain mgmt (core, LE, trunk)  Baseline: given on eval  Goal status: ongoing        PLAN: PT FREQUENCY: 2x/week   PT DURATION: 6 weeks   PLANNED INTERVENTIONS: Therapeutic exercises, Therapeutic activity, Neuromuscular re-education, Balance training, Gait training, Patient/Family education, Joint mobilization, Dry Needling, Electrical stimulation, Spinal mobilization, Cryotherapy, Moist heat, Taping, Manual therapy, and Re-evaluation.   PLAN FOR NEXT SESSION:  Manual. Stabilization extension based is working for her. Positioning.  Try quadruped?     Karie Mainland, PT 08/02/21 9:37 AM Phone: [phone] Fax: [phone]

## 2021-08-02 ENCOUNTER — Ambulatory Visit: Payer: Medicaid Other | Admitting: Physical Therapy

## 2021-08-02 DIAGNOSIS — M5416 Radiculopathy, lumbar region: Secondary | ICD-10-CM | POA: Diagnosis not present

## 2021-08-02 DIAGNOSIS — M6281 Muscle weakness (generalized): Secondary | ICD-10-CM

## 2021-08-06 NOTE — Congregational Nurse Program (Signed)
  Dept: [phone]   Congregational Nurse Program Note  Date of Encounter: 08/06/2021  Past Medical History: No past medical history on file.  Encounter Details:  CNP Questionnaire - 08/06/21 1153       Questionnaire   Do you give verbal consent to treat you today? Yes    Location Patient Served  [facility]    Visit Setting Church or Organization    Patient Status Refugee    Insurance [facility]    Insurance Referral N/A    Medication N/A    Medical Provider Yes    Screening Referrals N/A    Medical Referral N/A    Medical Appointment Made N/A    Food N/A    Transportation N/A    Housing/Utilities N/A    Interpersonal Safety N/A    Intervention Advocate;Navigate Healthcare System;Case Management;Counsel;Educate;Blood pressure;Support    ED Visit Averted Yes    Life-Saving Intervention Made N/A            Patient requesting reading glasses. Gave +1.25 glasses. Routine BP check 109/73.   Nicole Cella Merrick Maggio RN BSN PCCN  [facility] [phone]

## 2021-08-07 ENCOUNTER — Ambulatory Visit: Payer: Medicaid Other | Admitting: Physical Therapy

## 2021-08-07 ENCOUNTER — Encounter: Payer: Self-pay | Admitting: Physical Therapy

## 2021-08-07 DIAGNOSIS — M6281 Muscle weakness (generalized): Secondary | ICD-10-CM

## 2021-08-07 DIAGNOSIS — M5416 Radiculopathy, lumbar region: Secondary | ICD-10-CM

## 2021-08-07 NOTE — Therapy (Signed)
OUTPATIENT PHYSICAL THERAPY TREATMENT NOTE   Patient Name: Stacy Conner MRN: [id number] DOB:[birth year], 51 y.o., female Today's Date: 08/07/2021  PCP: Orion Crook, NP   REFERRING PROVIDER: Corrie Dandy Persons, PA    END OF SESSION:   PT End of Session - 08/07/21 0939     Visit Number 6    Number of Visits 12    Date for PT Re-Evaluation 08/19/21    Authorization Type [employer] - no auth for first 12 visits    PT Start Time 0937    PT Stop Time 1015    PT Time Calculation (min) 38 min                History reviewed. No pertinent past medical history. Past Surgical History:  Procedure Laterality Date   NO PAST SURGERIES     Patient Active Problem List   Diagnosis Date Noted   Low back pain 07/22/2021    REFERRING DIAG:    Low back pain with sciatica, sciatica laterality unspecified, unspecified back pain laterality, unspecified chronicity     THERAPY DIAG:  Radiculopathy, lumbar region  Muscle weakness (generalized)  Rationale for Evaluation and Treatment Rehabilitation  PRECAUTIONS: None   SUBJECTIVE: The pain is getting better. When I leave here I feel like I can do more activity. The right leg pain is better.  PAIN:  Are you having pain? Yes: NPRS scale:  8/10 Pain location: low back, Rt LE from hip to foot/toes.  Pain description: sharp, severe , burning  Aggravating factors: bending forward, lifting  Relieving factors: medication, massage   OBJECTIVE: (objective measures completed at initial evaluation unless otherwise dated)   DIAGNOSTIC FINDINGS:  None at this time    PATIENT SURVEYS:  NT due to language barrier   SCREENING FOR RED FLAGS: Bowel or bladder incontinence: No Spinal tumors: No Cauda equina syndrome: No Compression fracture: No Abdominal aneurysm: No   COGNITION:           Overall cognitive status: Within functional limits for tasks assessed                          SENSATION: LLE numb and tingling to foot       POSTURE: rounded shoulders, forward head, flexed trunk , and decr WB on Rt    PALPATION: Not tolerated to bilateral posterior legs , lumbar   LUMBAR ROM:    Active  A/PROM  eval  Flexion 50% pain   Extension 75% pain   Right lateral flexion    Left lateral flexion    Right rotation 50%  Left rotation 50%   (Blank rows = not tested)   LOWER EXTREMITY ROM:      Passive  Right eval Left eval  Hip flexion < 90  < 90  Hip extension      Hip abduction      Hip adduction      Hip internal rotation Limited, <20 Limited,  < 20  Hip external rotation 30 30  Knee flexion      Knee extension      Ankle dorsiflexion      Ankle plantarflexion      Ankle inversion      Ankle eversion       (Blank rows = not tested)   LOWER EXTREMITY MMT:     MMT Right eval Left eval  Hip flexion 4 4  Hip extension      Hip  abduction      Hip adduction      Hip internal rotation      Hip external rotation      Knee flexion 4- 4-  Knee extension 4- 4-  Ankle dorsiflexion 4- 4-  Ankle plantarflexion      Ankle inversion      Ankle eversion       (Blank rows = not tested)   LUMBAR SPECIAL TESTS:  Straight leg raise test: Positive and Slump test: Positive     GAIT: Distance walked: 75 Assistive device utilized:  none Level of assistance: Modified independence Comments: slow, mild limp        TODAY'S TREATMENT  [facility] Adult PT Treatment:                                                DATE: 08/07/21 Therapeutic Exercise: Nustep 6 min UE and LE level 5  Bilateral UE Wall slides x 10 trunk extension Wall slides for LE strength  x 10 Iso 1/2 squat with horizontal abd red band x 10  Diagonal pull red band x 10 , mod cues needed, alternating UE Standing red band Row x 10 Standing red band extension x 10  Bird dog x 10, 5 sec hold Quadruped stretching cat and cow to childs pose x 5  LTR Ab draw in with Alt knee lift x 10 from table  Pain with 90/90 so disc. Bridge x 10 SLR  with core focus x 10    [facility] Adult PT Treatment:                                                DATE: 08/02/21 Therapeutic Exercise: Nustep 6 min UE and LE level 5  Bilateral UE Wall slides x 10 trunk extension Wall slides for LE strength  x 10 Iso 1/2 squat with horizontal abd red band x 10  Diagonal pull red band x 10 , mod cues needed, alternating UE Quadruped stretching cat and cow to childs pose  Bird dog x 5 , 5 sec hold Hip extension x 10 each side, mod cues to avoid hyperextension   Supine lower trunk rotation x 5 wide knees Supine 90/90 for core comprehension then into stability Alt knee lift x 10 from table  SLR with core focus x 10  Bridge x 10   [facility] Adult PT Treatment:                                                DATE: 07/31/21 Therapeutic Exercise: Supine hamstring stretch 30 sec x 3 each side  Lower trunk rotation x 5 Prone hip extension x 10 each  Prone on elbows prop x 1 min  Scapular press up x 5  Hands on mat , full press up x 10 sec x 5  UE/LE "swimming"  x 5  Cat and camel x 5  Childs pose x 3 Bird dog x 5  Sit to stand x 10 x 2 sets,added 10 lbs 2nd set Squat x 10 with 10 lbs x 1 Self Care: Spine  anatomy, extension vs flexion , disc, review MRI report  [facility] Adult PT Treatment:                                                DATE: 07/26/21 Therapeutic Exercise: Spine flexion vs extension, assessing pain Wall for spine extension, leaning hips to wall x 5 Prone with 1 pillow , breathing with lower abdominal draw it.  Prone knee flexion x 10 alternating Prone hip ext. X 10 alt.  Childs pose x 5 rocking  Mini bridge x 10  Lower trunk rotation x 10  Manual Therapy: LAD brief Rt LE x 5 Self Care: Body mechanics , bed mobility , donning shoes   [facility] Adult PT Treatment:                                                DATE: 07/24/21 Therapeutic Exercise: LTR PPT- increased pain Mini bridge -well tolerated Hooklying: Hamstring stretch with ankle  pumps Hooklying clam red band  Prone positioning over 2 pillows -after 5 minutes back and leg pain resolved Increased pain with transition out of prone   PATIENT EDUCATION:  Education details:pain scale, extension, body mechanics to reduce strain  Person educated: Patient Education method: Programmer, multimedia, Facilities manager, Verbal cues, and Handouts Education comprehension: verbalized understanding, returned demonstration, and needs further education     HOME EXERCISE PROGRAM: Access Code: [email or web address] URL: [email or web address] Date: 07/26/2021 Prepared by: Karie Mainland  Exercises - Supine with Legs Supported at 90/90  - 1-2 x daily - 7 x weekly - 1 sets - 1 reps - 15 min with ice hold - Standing Lumbar Extension at Wall  - 3 x daily - 7 x weekly - 1 sets - 10 reps - 10 hold - Sidelying Diaphragmatic Breathing  - 3 x daily - 7 x weekly - 1 sets - 10 reps - 30 hold - Supine Lower Trunk Rotation  - 1 x daily - 7 x weekly - 1 sets - 10 reps - 5 hold - Bridge  - 1 x daily - 7 x weekly - 2 sets - 10 reps - Lying Prone with 2 Pillows  - 1 x daily - 7 x weekly - 1 sets - 2-5 hold - Prone Hip Extension with Pillow Under Abdomen  - 1 x daily - 7 x weekly - 2 sets - 10 reps - 5 hold - Child's Pose Stretch  - 1 x daily - 7 x weekly - 1 sets - 10 reps - 30 hold  Patient Education - Rolling From Your Back to Your Side  ASSESSMENT:   CLINICAL IMPRESSION: Pt reports less RLE pain and min decreased pain in back from 10/10 to 8/10 in low back. She reports feeling good after physical theray with more ability to complete activity at home afterward. She demonstrates improved  pain behaviors with transfers and is tolerating increased therex each session.     OBJECTIVE IMPAIRMENTS decreased activity tolerance, decreased endurance, decreased mobility, difficulty walking, decreased ROM, decreased strength, hypomobility, increased fascial restrictions, impaired flexibility, impaired sensation,  improper body mechanics, and pain.    ACTIVITY LIMITATIONS carrying, lifting, bending, sitting, standing, squatting, sleeping, stairs, transfers, bed mobility, dressing, reach over head, and caring for others  PARTICIPATION LIMITATIONS: meal prep, cleaning, laundry, interpersonal relationship, shopping, and community activity   PERSONAL FACTORS Past/current experiences and 1 comorbidity: chronic back pain   are also affecting patient's functional outcome, as well as being newcomer to [place].   REHAB POTENTIAL: Good   CLINICAL DECISION MAKING: Stable/uncomplicated   EVALUATION COMPLEXITY: Low     GOALS:   LONG TERM GOALS: Target date: 08/19/2021   Pt will report centralization of pain from LEs for 50% of the time. Baseline: Rt LE pain , LLE numb, tingling  Goal status: improving, very light pain in Rt LE and none in LLE   2.  Pt will be able to lift and carry her child with pain < 5/10 most of the time in her low back  Baseline: pain severe, needs cues to lift and use proper mechanics Goal status: in progress   3.  Pt will be able stand for light housework (dishes, cooking) with pain  < 5/10 for 15 min or more  Baseline: severe, < 10 min  Goal status: in progress    4.  Pt will improve LE strength to 4+/5 or more in LEs  Baseline: 4-/5 Goal status: ongoing    5.  Pt will be I with HEP for long term pain mgmt (core, LE, trunk)  Baseline: given on eval  Goal status: ongoing        PLAN: PT FREQUENCY: 2x/week   PT DURATION: 6 weeks   PLANNED INTERVENTIONS: Therapeutic exercises, Therapeutic activity, Neuromuscular re-education, Balance training, Gait training, Patient/Family education, Joint mobilization, Dry Needling, Electrical stimulation, Spinal mobilization, Cryotherapy, Moist heat, Taping, Manual therapy, and Re-evaluation.   PLAN FOR NEXT SESSION:  Manual. Stabilization extension based is working for her. Positioning.      Jannette Spanner, PTA 08/07/21 10:14  AM Phone: [phone] Fax: [phone]

## 2021-08-09 ENCOUNTER — Ambulatory Visit: Payer: Medicaid Other | Admitting: Physical Therapy

## 2021-08-09 ENCOUNTER — Encounter (HOSPITAL_COMMUNITY): Payer: Self-pay | Admitting: Emergency Medicine

## 2021-08-09 ENCOUNTER — Ambulatory Visit (HOSPITAL_COMMUNITY)
Admission: EM | Admit: 2021-08-09 | Discharge: 2021-08-09 | Disposition: A | Discharge status: Home / Self Care | Payer: Medicaid Other | Attending: Family Medicine | Admitting: Family Medicine

## 2021-08-09 ENCOUNTER — Encounter: Payer: Self-pay | Admitting: Physical Therapy

## 2021-08-09 DIAGNOSIS — L239 Allergic contact dermatitis, unspecified cause: Secondary | ICD-10-CM | POA: Diagnosis not present

## 2021-08-09 DIAGNOSIS — M6281 Muscle weakness (generalized): Secondary | ICD-10-CM

## 2021-08-09 DIAGNOSIS — M5416 Radiculopathy, lumbar region: Secondary | ICD-10-CM

## 2021-08-09 DIAGNOSIS — H109 Unspecified conjunctivitis: Secondary | ICD-10-CM | POA: Diagnosis not present

## 2021-08-09 MED ORDER — KETOCONAZOLE 2 % EX SHAM: 1.0000 | MEDICATED_SHAMPOO | CUTANEOUS | 0 refills | Status: AC

## 2021-08-09 MED ORDER — FLUTICASONE PROPIONATE 50 MCG/ACT NA SUSP: 2.0000 | Freq: Every day | NASAL | 0 refills | Status: DC

## 2021-08-09 MED ORDER — GENTAMICIN SULFATE 0.3 % OP SOLN: 2.0000 [drp] | Freq: Three times a day (TID) | OPHTHALMIC | 0 refills | Status: AC

## 2021-08-09 MED ORDER — PREDNISONE 20 MG PO TABS: 40.0000 mg | ORAL_TABLET | Freq: Every day | ORAL | 0 refills | Status: AC

## 2021-08-09 NOTE — ED Triage Notes (Addendum)
Patient c/o bilateral eye redness and hair loss x 3 days.   Patients mother endorses scalp itching.   Patients mother bilateral eye redness.   Patient endorses chest congestion.   Patient endorses itchy throat.   Patient has used "allergy pills" with no relief of symptoms.

## 2021-08-14 ENCOUNTER — Ambulatory Visit: Payer: Medicaid Other | Admitting: Physical Therapy

## 2021-08-14 ENCOUNTER — Encounter: Payer: Self-pay | Admitting: Physical Therapy

## 2021-08-14 DIAGNOSIS — M5416 Radiculopathy, lumbar region: Secondary | ICD-10-CM

## 2021-08-14 DIAGNOSIS — M6281 Muscle weakness (generalized): Secondary | ICD-10-CM

## 2021-08-14 NOTE — Therapy (Addendum)
OUTPATIENT PHYSICAL THERAPY TREATMENT NOTE   Patient Name: Stacy Conner MRN: [id number] DOB:[birth year], 51 y.o., female Today's Date: 08/14/2021  PCP: Bo Merino, NP   REFERRING PROVIDER: Stanton Kidney Persons, PA    END OF SESSION:   PT End of Session - 08/14/21 0938     Visit Number 8    Number of Visits 12    Date for PT Re-Evaluation 08/19/21    Authorization Type [employer] - no auth for first 12 visits    PT Start Time 0935    PT Stop Time 1013    PT Time Calculation (min) 38 min                History reviewed. No pertinent past medical history. Past Surgical History:  Procedure Laterality Date   NO PAST SURGERIES     Patient Active Problem List   Diagnosis Date Noted   Low back pain 07/22/2021    REFERRING DIAG:    Low back pain with sciatica, sciatica laterality unspecified, unspecified back pain laterality, unspecified chronicity     THERAPY DIAG:  Radiculopathy, lumbar region  Muscle weakness (generalized)  Rationale for Evaluation and Treatment Rehabilitation  PRECAUTIONS: None   SUBJECTIVE: I went to the store but I did not carry heavy things. My kids are out of school and can help right now. My pain is intermittent now. It is no longer constant.  PAIN:  Are you having pain? Yes: NPRS scale:  7/10 Pain location: low back, Rt LE from hip to foot/toes.  Pain description: sharp, severe , burning  Aggravating factors: bending forward, lifting  Relieving factors: medication, massage   OBJECTIVE: (objective measures completed at initial evaluation unless otherwise dated)   DIAGNOSTIC FINDINGS:  None at this time    PATIENT SURVEYS:  NT due to language barrier   SCREENING FOR RED FLAGS: Bowel or bladder incontinence: No Spinal tumors: No Cauda equina syndrome: No Compression fracture: No Abdominal aneurysm: No   COGNITION:           Overall cognitive status: Within functional limits for tasks assessed                           SENSATION: LLE numb and tingling to foot      POSTURE: rounded shoulders, forward head, flexed trunk , and decr WB on Rt    PALPATION: Not tolerated to bilateral posterior legs , lumbar   LUMBAR ROM:    Active  A/PROM  eval AROM 08/09/21  Flexion 50% pain   distal shin p!  Extension 75% pain  75%  p!  Right lateral flexion   Past knee no pain  Left lateral flexion   Past knee no pain  Right rotation 50% 75% no pain  Left rotation 50% 75% no pain   (Blank rows = not tested)   LOWER EXTREMITY ROM:      Passive  Right eval Left eval  Hip flexion < 90  < 90  Hip extension      Hip abduction      Hip adduction      Hip internal rotation Limited, <20 Limited,  < 20  Hip external rotation 30 30  Knee flexion      Knee extension      Ankle dorsiflexion      Ankle plantarflexion      Ankle inversion      Ankle eversion       (  Blank rows = not tested)   LOWER EXTREMITY MMT:     MMT Right eval Left eval Right 08/14/21 Left 08/14/21  Hip flexion _0 Hip extension        Hip abduction     5 5  Hip adduction        Hip internal rotation        Hip external rotation        Knee flexion 4- 4- 5 5  Knee extension 4- 4- 5 5  Ankle dorsiflexion 4- 4-    Ankle plantarflexion        Ankle inversion        Ankle eversion         (Blank rows = not tested)   LUMBAR SPECIAL TESTS:  Straight leg raise test: Positive and Slump test: Positive     GAIT: Distance walked: 75 Assistive device utilized:  none Level of assistance: Modified independence Comments: slow, mild limp        TODAY'S TREATMENT  [facility] Adult PT Treatment:                                                DATE: 08/14/21 Therapeutic Exercise: MMT Bird dogs Forearm plank  10 sec x 3 from knees - min pain with lowering out of plank Cat/camel to childs pose PPT with knee reach x10 90/90 x 6 , well tolerated today SLR with core focus  2 x 10 , 2#  Row green x 20 Shoulder ext green x 20 Palloff  press single green band x 15 each  STS from mat 2 x 15 with 15# KB   [facility] Adult PT Treatment:                                                DATE: 08/09/21 Therapeutic Exercise: Nustep 6 min UE and LE level 5  Bilateral UE Wall slides x 10 trunk extension Row green x 20 Shoulder ext green x 20 Palloff press single green band x 15 each  STS from mat x 10, x 10 with 10#  Bird dog x 10, 5 sec hold Quadruped stretching cat and cow to childs pose x 5  Ab draw in with Alt knee lift x 10 from table 90/90 x 4 pain  Pain with 90/90 so disc.  SLR with core focus x 10   [facility] Adult PT Treatment:                                                DATE: 08/07/21 Therapeutic Exercise: Nustep 6 min UE and LE level 5  Bilateral UE Wall slides x 10 trunk extension Wall slides for LE strength  x 10 Iso 1/2 squat with horizontal abd red band x 10  Diagonal pull red band x 10 , mod cues needed, alternating UE Standing red band Row x 10 Standing red band extension x 10  Bird dog x 10, 5 sec hold Quadruped stretching cat and cow to childs pose x 5  LTR Ab draw in with Alt knee  lift x 10 from table  Pain with 90/90 so disc. Bridge x 10 SLR with core focus x 10    [facility] Adult PT Treatment:                                                DATE: 08/02/21 Therapeutic Exercise: Nustep 6 min UE and LE level 5  Bilateral UE Wall slides x 10 trunk extension Wall slides for LE strength  x 10 Iso 1/2 squat with horizontal abd red band x 10  Diagonal pull red band x 10 , mod cues needed, alternating UE Quadruped stretching cat and cow to childs pose  Bird dog x 5 , 5 sec hold Hip extension x 10 each side, mod cues to avoid hyperextension   Supine lower trunk rotation x 5 wide knees Supine 90/90 for core comprehension then into stability Alt knee lift x 10 from table  SLR with core focus x 10  Bridge x 10   [facility] Adult PT Treatment:                                                DATE: 07/31/21 Therapeutic  Exercise: Supine hamstring stretch 30 sec x 3 each side  Lower trunk rotation x 5 Prone hip extension x 10 each  Prone on elbows prop x 1 min  Scapular press up x 5  Hands on mat , full press up x 10 sec x 5  UE/LE "swimming"  x 5  Cat and camel x 5  Childs pose x 3 Bird dog x 5  Sit to stand x 10 x 2 sets,added 10 lbs 2nd set Squat x 10 with 10 lbs x 1 Self Care: Spine anatomy, extension vs flexion , disc, review MRI report  [facility] Adult PT Treatment:                                                DATE: 07/26/21 Therapeutic Exercise: Spine flexion vs extension, assessing pain Wall for spine extension, leaning hips to wall x 5 Prone with 1 pillow , breathing with lower abdominal draw it.  Prone knee flexion x 10 alternating Prone hip ext. X 10 alt.  Childs pose x 5 rocking  Mini bridge x 10  Lower trunk rotation x 10  Manual Therapy: LAD brief Rt LE x 5 Self Care: Body mechanics , bed mobility , donning shoes   [facility] Adult PT Treatment:                                                DATE: 07/24/21 Therapeutic Exercise: LTR PPT- increased pain Mini bridge -well tolerated Hooklying: Hamstring stretch with ankle pumps Hooklying clam red band  Prone positioning over 2 pillows -after 5 minutes back and leg pain resolved Increased pain with transition out of prone   PATIENT EDUCATION:  Education details:pain scale, extension, body mechanics to reduce strain  Person educated: Patient Education method: Consulting civil engineer, Media planner,  Verbal cues, and Handouts Education comprehension: verbalized understanding, returned demonstration, and needs further education     HOME EXERCISE PROGRAM: Access Code: [email or web address] URL: [email or web address] Date: 07/26/2021 Prepared by: Raeford Razor  Exercises - Supine with Legs Supported at 90/90  - 1-2 x daily - 7 x weekly - 1 sets - 1 reps - 15 min with ice hold - Standing Lumbar Extension at [place]  - 3 x daily - 7 x weekly - 1 sets -  10 reps - 10 hold - Sidelying Diaphragmatic Breathing  - 3 x daily - 7 x weekly - 1 sets - 10 reps - 30 hold - Supine Lower Trunk Rotation  - 1 x daily - 7 x weekly - 1 sets - 10 reps - 5 hold - Bridge  - 1 x daily - 7 x weekly - 2 sets - 10 reps - Lying Prone with 2 Pillows  - 1 x daily - 7 x weekly - 1 sets - 2-5 hold - Prone Hip Extension with Pillow Under Abdomen  - 1 x daily - 7 x weekly - 2 sets - 10 reps - 5 hold - Child's Pose Stretch  - 1 x daily - 7 x weekly - 1 sets - 10 reps - 30 hold  Patient Education - Rolling From Your Back to Your Side  ASSESSMENT:   CLINICAL IMPRESSION: Pt reports her pain level is lower and has become more intermittent. Her children helped more with carrying groceries since they are out of school. She reports right leg numbness when she lifts her son however not as much pain as before. Continued with core stabilization in various positions. 90/90 hip/knee flexion for abdominal strength well tolerated today. Able to begin plank on knee with min pain during lowering to mat after plank. Continued STS with min cues for correct hip hinge. Overall her pain behaviors continue to improve with therex and transfers.  Her MMT has significantly improved. She is making good progress toward LTGs. LTG#4 met. Will need ERO vs discharge next visit.      OBJECTIVE IMPAIRMENTS decreased activity tolerance, decreased endurance, decreased mobility, difficulty walking, decreased ROM, decreased strength, hypomobility, increased fascial restrictions, impaired flexibility, impaired sensation, improper body mechanics, and pain.    ACTIVITY LIMITATIONS carrying, lifting, bending, sitting, standing, squatting, sleeping, stairs, transfers, bed mobility, dressing, reach over head, and caring for others   PARTICIPATION LIMITATIONS: meal prep, cleaning, laundry, interpersonal relationship, shopping, and community activity   PERSONAL FACTORS Past/current experiences and 1 comorbidity:  chronic back pain   are also affecting patient's functional outcome, as well as being newcomer to [place].   REHAB POTENTIAL: Good   CLINICAL DECISION MAKING: Stable/uncomplicated   EVALUATION COMPLEXITY: Low     GOALS:   LONG TERM GOALS: Target date: 08/19/2021   Pt will report centralization of pain from LEs for 50% of the time. Baseline: Rt LE pain , LLE numb, tingling  status: improving, very light pain in Rt LE and none in LLE Goals status: ONGOING   2.  Pt will be able to lift and carry her child with pain < 5/10 most of the time in her low back  Baseline: pain severe, needs cues to lift and use proper mechanics Status: 08/14/21: has right leg numbness with lifting son, not pain Goal status: ONGOING   3.  Pt will be able stand for light housework (dishes, cooking) with pain  < 5/10 for 15 min or more  Baseline: severe, < 10 min  Status:08/14/21:  no pain with standing ADLs, now pain present with stairs and prolonged sitting.  Goal status: ONGOING  4.  Pt will improve LE strength to 4+/5 or more in LEs  Baseline: 4-/5 Status 08/14/21  5/5 Goal status: MET   5.  Pt will be I with HEP for long term pain mgmt (core, LE, trunk)  Baseline: given on eval  Goal status: ongoing        PLAN: PT FREQUENCY: 2x/week   PT DURATION: 6 weeks   PLANNED INTERVENTIONS: Therapeutic exercises, Therapeutic activity, Neuromuscular re-education, Balance training, Gait training, Patient/Family education, Joint mobilization, Dry Needling, Electrical stimulation, Spinal mobilization, Cryotherapy, Moist heat, Taping, Manual therapy, and Re-evaluation.   PLAN FOR NEXT SESSION: ERO vs DC at next visit. Stabilization extension based is working for her. Positioning.  closed chain strength and core, check LTGs , check stairs for pain    Hessie Diener, PTA 08/14/21 10:14 AM Phone: [phone] Fax: [phone]

## 2021-08-16 ENCOUNTER — Encounter: Payer: Self-pay | Admitting: Physical Therapy

## 2021-08-16 ENCOUNTER — Ambulatory Visit: Payer: Medicaid Other | Admitting: Physical Therapy

## 2021-08-16 DIAGNOSIS — M6281 Muscle weakness (generalized): Secondary | ICD-10-CM

## 2021-08-16 DIAGNOSIS — M5416 Radiculopathy, lumbar region: Secondary | ICD-10-CM | POA: Diagnosis not present

## 2021-08-16 NOTE — Therapy (Signed)
OUTPATIENT PHYSICAL THERAPY TREATMENT NOTE RENEWAL   Patient Name: Stacy Conner MRN: [id number] DOB:30-Oct-1970, 51 y.o., female Today's Date: 08/16/2021  PCP: Stacy Merino, NP   REFERRING PROVIDER: Stanton Kidney Persons, PA    END OF SESSION:   PT End of Session - 08/16/21 0930     Visit Number 9    Number of Visits 17    Date for PT Re-Evaluation 09/13/21    Authorization Type [employer] - no auth for first 12 visits    PT Start Time 0930    PT Stop Time 1011    PT Time Calculation (min) 41 min    Activity Tolerance No increased pain;Patient tolerated treatment well    Behavior During Therapy [facility] for tasks assessed/performed                History reviewed. No pertinent past medical history. Past Surgical History:  Procedure Laterality Date   NO PAST SURGERIES     Patient Active Problem List   Diagnosis Date Noted   Low back pain 07/22/2021    REFERRING DIAG:    Low back pain with sciatica, sciatica laterality unspecified, unspecified back pain laterality, unspecified chronicity     THERAPY DIAG:  Radiculopathy, lumbar region  Muscle weakness (generalized)  Rationale for Evaluation and Treatment Rehabilitation  PRECAUTIONS: None   SUBJECTIVE:  Patient feels good she would like to continue therapy.  She sometimes get busy, distracted and it is hard to find the time to do her exercises. The pain used to be burning and all the way down her leg.  It is now improving and it less.  (Sounds like a centralizing)    PAIN:  Are you having pain? Yes: NPRS scale:  6/10 Pain location: low back, Rt LE from hip to foot/toes.  Pain description: sharp, severe , burning  Aggravating factors: bending forward, lifting  Relieving factors: medication, massage   OBJECTIVE: (objective measures completed at initial evaluation unless otherwise dated)   DIAGNOSTIC FINDINGS:  None at this time    PATIENT SURVEYS:  NT due to language barrier   SCREENING FOR RED  FLAGS: Bowel or bladder incontinence: No Spinal tumors: No Cauda equina syndrome: No Compression fracture: No Abdominal aneurysm: No   COGNITION:           Overall cognitive status: Within functional limits for tasks assessed                          SENSATION: LLE numb and tingling to foot      POSTURE: rounded shoulders, forward head, flexed trunk , and decr WB on Rt    PALPATION: Not tolerated to bilateral posterior legs , lumbar   LUMBAR ROM:    Active  A/PROM  eval AROM 08/09/21 AROM  08/16/21  Flexion 50% pain   distal shin p! WFL with stretch no pain   Extension 75% pain  75%  p! WFL with slight pain   Right lateral flexion   Past knee no pain   Left lateral flexion   Past knee no pain   Right rotation 50% 75% no pain WFL   Left rotation 50% 75% no pain WFL    (Blank rows = not tested)   LOWER EXTREMITY ROM:      Passive  Right eval Left eval  Hip flexion < 90  < 90  Hip extension      Hip abduction      Hip  adduction      Hip internal rotation Limited, <20 Limited,  < 20  Hip external rotation 30 30  Knee flexion      Knee extension      Ankle dorsiflexion      Ankle plantarflexion      Ankle inversion      Ankle eversion       (Blank rows = not tested)   LOWER EXTREMITY MMT:     MMT Right eval Left eval Right 08/14/21 Left 08/14/21  Hip flexion 4 4 5 5   Hip extension        Hip abduction     5 5  Hip adduction        Hip internal rotation        Hip external rotation        Knee flexion 4- 4- 5 5  Knee extension 4- 4- 5 5  Ankle dorsiflexion 4- 4-    Ankle plantarflexion        Ankle inversion        Ankle eversion         (Blank rows = not tested)   LUMBAR SPECIAL TESTS:  Straight leg raise test: Positive and Slump test: Positive     GAIT: Distance walked: 75 Assistive device utilized:  none Level of assistance: Modified independence Comments: slow, mild limp        TODAY'S TREATMENT    [facility] Adult PT Treatment:                                                 DATE: 08/16/21 Therapeutic Exercise: Squat edge of mat x 10  Sit to stand x 10 with 10 lbs chest press  Supine 90/90 hands under tailbone , toe taps alt.  2 x 10  Bridge x 10  Bridge with feet on dynadisc , clam Monster plank 3 x 15 sec-20 sec  NO PAIN Childs pose   Bird dog x 8 each side  Standing springboard : extension x 10 , high row x 10  Singlel arm sidefacing shoulder adduction x 10 for core , breathing   [facility] Adult PT Treatment:                                                DATE: 08/14/21 Therapeutic Exercise: MMT Bird dogs Forearm plank  10 sec x 3 from knees - min pain with lowering out of plank Cat/camel to childs pose PPT with knee reach x10 90/90 x 6 , well tolerated today SLR with core focus  2 x 10 , 2#  Row green x 20 Shoulder ext green x 20 Palloff press single green band x 15 each  STS from mat 2 x 15 with 15# KB    PATIENT EDUCATION:  Education details:pain scale, extension, body mechanics to reduce strain  Person educated: Patient Education method: Consulting civil engineer, Media planner, Verbal cues, and Handouts Education comprehension: verbalized understanding, returned demonstration, and needs further education     HOME EXERCISE PROGRAM: Access Code: [email or web address] URL: [email or web address] Date: 07/26/2021 Prepared by: Stacy Conner Access Code: [email or web address] URL: [email or web address] Date: 08/16/2021 Prepared by: Stacy Conner  Exercises - Bridge  - 1 x daily - 7 x weekly -  2 sets - 10 reps - Prone Hip Extension with Pillow Under Abdomen  - 1 x daily - 7 x weekly - 2 sets - 10 reps - 5 hold - Child's Pose Stretch  - 1 x daily - 7 x weekly - 1 sets - 5 reps - 30 hold - Squat with Chair Touch  - 1 x daily - 7 x weekly - 2 sets - 10 reps - Supine 90/90 Alternating Heel Touches with Posterior Pelvic Tilt  - 1 x daily - 7 x weekly - 2 sets - 10 reps - 5 hold - Bird Dog  - 1 x daily - 7 x weekly - 2 sets -  10 reps - 5 hold  Patient Education - Rolling From Your Back to Your Side   ASSESSMENT:   CLINICAL IMPRESSION:  [name] has made significant functional  improvement and shows reduced pain behaviors since beginning therapy. She was renewed for another 4 weeks of PT. Trunk ROM and strength has normalized, despite higher pain levels.  She will now focus more on core strength and function in order for her to return to more demanding tasks of home and childcare.      OBJECTIVE IMPAIRMENTS decreased activity tolerance, decreased endurance, decreased mobility, difficulty walking, decreased ROM, decreased strength, hypomobility, increased fascial restrictions, impaired flexibility, impaired sensation, improper body mechanics, and pain.    ACTIVITY LIMITATIONS carrying, lifting, bending, sitting, standing, squatting, sleeping, stairs, transfers, bed mobility, dressing, reach over head, and caring for others   PARTICIPATION LIMITATIONS: meal prep, cleaning, laundry, interpersonal relationship, shopping, and community activity   PERSONAL FACTORS Past/current experiences and 1 comorbidity: chronic back pain   are also affecting patient's functional outcome, as well as being newcomer to [place].   REHAB POTENTIAL: Good   CLINICAL DECISION MAKING: Stable/uncomplicated   EVALUATION COMPLEXITY: Low     GOALS:   LONG TERM GOALS: Target date: 09/13/21   Pt will report centralization of pain from LEs for 50% of the time. Baseline: Rt LE pain , LLE numb, tingling  status: improving, very light pain in Rt LE and none in LLE Goals status: ONGOING   2.  Pt will be able to lift and carry her child with pain < 5/10 most of the time in her low back  Baseline: pain severe, needs cues to lift and use proper mechanics Status: 08/14/21: has right leg numbness with lifting son, not pain Goal status: ONGOING   3.  Pt will be able stand for light housework (dishes, cooking) with pain  < 5/10 for 15 min or more   Baseline: severe, < 10 min  Status:08/14/21:  no pain with standing ADLs, now pain present with stairs and prolonged sitting.  Goal status: ONGOING  4.  Pt will improve LE strength to 4+/5 or more in LEs  Baseline: 4-/5 Status 08/14/21  5/5 Goal status: MET   5.  Pt will be I with HEP for long term pain mgmt (core, LE, trunk)  Baseline: given on eval  Goal status: ongoing        PLAN: PT FREQUENCY: 2x/week   PT DURATION: 4 weeks   PLANNED INTERVENTIONS: Therapeutic exercises, Therapeutic activity, Neuromuscular re-education, Balance training, Gait training, Patient/Family education, Joint mobilization, Dry Needling, Electrical stimulation, Spinal mobilization, Cryotherapy, Moist heat, Taping, Manual therapy, and Re-evaluation.   PLAN FOR NEXT SESSION: Stabilization extension based is working for her. Positioning.  closed chain strength and core,  check stairs for pain   Stacy Conner, PT  08/16/21 10:12 AM Phone: [phone] Fax: [phone]

## 2021-08-19 ENCOUNTER — Ambulatory Visit: Payer: Medicaid Other | Admitting: Physician Assistant

## 2021-08-26 ENCOUNTER — Telehealth: Payer: Self-pay | Admitting: Physical Therapy

## 2021-08-26 ENCOUNTER — Ambulatory Visit: Payer: Medicaid Other | Attending: Physician Assistant | Admitting: Physical Therapy

## 2021-08-26 DIAGNOSIS — M6281 Muscle weakness (generalized): Secondary | ICD-10-CM | POA: Insufficient documentation

## 2021-08-26 DIAGNOSIS — M5416 Radiculopathy, lumbar region: Secondary | ICD-10-CM | POA: Insufficient documentation

## 2021-08-26 NOTE — Telephone Encounter (Signed)
Contacted patient via interpreter regarding no show. She reports that she got confused after she came here July 3rd and was told she was in the wrong place. Confirmed next appointment date/time.

## 2021-08-28 ENCOUNTER — Ambulatory Visit: Payer: Medicaid Other | Admitting: Physical Therapy

## 2021-08-29 ENCOUNTER — Telehealth: Payer: Self-pay | Admitting: Physical Therapy

## 2021-08-29 NOTE — Telephone Encounter (Signed)
Attempted to contact patient regarding no show to her appointment on 08/28/21. No answer at both available numbers (home and mobile) and not able to leave voicemail.

## 2021-09-04 ENCOUNTER — Ambulatory Visit: Payer: Medicaid Other | Admitting: Physical Therapy

## 2021-09-04 ENCOUNTER — Encounter: Payer: Self-pay | Admitting: Physical Therapy

## 2021-09-04 DIAGNOSIS — M5416 Radiculopathy, lumbar region: Secondary | ICD-10-CM | POA: Diagnosis not present

## 2021-09-04 DIAGNOSIS — M6281 Muscle weakness (generalized): Secondary | ICD-10-CM | POA: Diagnosis present

## 2021-09-04 NOTE — Therapy (Signed)
OUTPATIENT PHYSICAL THERAPY TREATMENT NOTE   Patient Name: Stacy Conner MRN: [id number] DOB:1970-08-27, 51 y.o., female Today's Date: 09/04/2021  PCP: Bo Merino, NP   REFERRING PROVIDER: Stanton Kidney Persons, PA    END OF SESSION:   PT End of Session - 09/04/21 0849     Visit Number 10    Number of Visits 17    Date for PT Re-Evaluation 09/13/21    Authorization Type [employer] - no auth for first 12 visits    PT Start Time 0847    PT Stop Time 0929    PT Time Calculation (min) 42 min                History reviewed. No pertinent past medical history. Past Surgical History:  Procedure Laterality Date   NO PAST SURGERIES     Patient Active Problem List   Diagnosis Date Noted   Low back pain 07/22/2021    REFERRING DIAG:    Low back pain with sciatica, sciatica laterality unspecified, unspecified back pain laterality, unspecified chronicity     THERAPY DIAG:  Radiculopathy, lumbar region  Muscle weakness (generalized)  Rationale for Evaluation and Treatment Rehabilitation  PRECAUTIONS: None   SUBJECTIVE:  Patient reports no pain. She does report heat that comes from Right low back mostly at night.      PAIN:  Are you having pain? Yes: NPRS scale:  0/10 Pain location: low back, Rt LE from hip to foot/toes.  Pain description: sharp, severe , burning  Aggravating factors: bending forward, lifting  Relieving factors: medication, massage   OBJECTIVE: (objective measures completed at initial evaluation unless otherwise dated)   DIAGNOSTIC FINDINGS:  None at this time    PATIENT SURVEYS:  NT due to language barrier   SCREENING FOR RED FLAGS: Bowel or bladder incontinence: No Spinal tumors: No Cauda equina syndrome: No Compression fracture: No Abdominal aneurysm: No   COGNITION:           Overall cognitive status: Within functional limits for tasks assessed                          SENSATION: LLE numb and tingling to foot       POSTURE: rounded shoulders, forward head, flexed trunk , and decr WB on Rt    PALPATION: Not tolerated to bilateral posterior legs , lumbar   LUMBAR ROM:    Active  A/PROM  eval AROM 08/09/21 AROM  08/16/21  Flexion 50% pain   distal shin p! WFL with stretch no pain   Extension 75% pain  75%  p! WFL with slight pain   Right lateral flexion   Past knee no pain   Left lateral flexion   Past knee no pain   Right rotation 50% 75% no pain WFL   Left rotation 50% 75% no pain WFL    (Blank rows = not tested)   LOWER EXTREMITY ROM:      Passive  Right eval Left eval  Hip flexion < 90  < 90  Hip extension      Hip abduction      Hip adduction      Hip internal rotation Limited, <20 Limited,  < 20  Hip external rotation 30 30  Knee flexion      Knee extension      Ankle dorsiflexion      Ankle plantarflexion      Ankle inversion  Ankle eversion       (Blank rows = not tested)   LOWER EXTREMITY MMT:     MMT Right eval Left eval Right 08/14/21 Left 08/14/21  Hip flexion 4 4 5 5   Hip extension        Hip abduction     5 5  Hip adduction        Hip internal rotation        Hip external rotation        Knee flexion 4- 4- 5 5  Knee extension 4- 4- 5 5  Ankle dorsiflexion 4- 4-    Ankle plantarflexion        Ankle inversion        Ankle eversion         (Blank rows = not tested)   LUMBAR SPECIAL TESTS:  Straight leg raise test: Positive and Slump test: Positive     GAIT: Distance walked: 75 Assistive device utilized:  none Level of assistance: Modified independence Comments: slow, mild limp        TODAY'S TREATMENT  [facility] Adult PT Treatment:                                                DATE: 09/04/21 Therapeutic Exercise: Nustep L5 UE/LE x 5 min 7# palloff press x 10 x 2 each Standing springboard : extension x 15 , high row x 15  Sit to stand x 15 with 15 lb KB at chest height  Bridge with feet on dyna disc 5 sec x 10 , with clam AROM x 10 Supine 90/90  hands under tailbone , toe taps alt.  2 x 10  Instucted in green band HEP for Row, ext and paloff press  [facility] Adult PT Treatment:                                                DATE: 08/16/21 Therapeutic Exercise: Squat edge of mat x 10  Sit to stand x 10 with 10 lbs chest press  Supine 90/90 hands under tailbone , toe taps alt.  2 x 10  Bridge x 10  Bridge with feet on dynadisc , clam Monster plank 3 x 15 sec-20 sec  NO PAIN Childs pose   Bird dog x 8 each side  Standing springboard : extension x 10 , high row x 10  Singlel arm sidefacing shoulder adduction x 10 for core , breathing   [facility] Adult PT Treatment:                                                DATE: 08/14/21 Therapeutic Exercise: MMT Bird dogs Forearm plank  10 sec x 3 from knees - min pain with lowering out of plank Cat/camel to childs pose PPT with knee reach x10 90/90 x 6 , well tolerated today SLR with core focus  2 x 10 , 2#  Row green x 20 Shoulder ext green x 20 Palloff press single green band x 15 each  STS from mat 2 x 15 with 15# KB  PATIENT EDUCATION:  Education details:pain scale, extension, body mechanics to reduce strain  Person educated: Patient Education method: Consulting civil engineer, Media planner, Verbal cues, and Handouts Education comprehension: verbalized understanding, returned demonstration, and needs further education     HOME EXERCISE PROGRAM: Access Code: [email or web address] URL: [email or web address] Date: 07/26/2021 Prepared by: Raeford Razor Access Code: [email or web address] URL: [email or web address] Date: 08/16/2021 Prepared by: Raeford Razor  Exercises - Bridge  - 1 x daily - 7 x weekly - 2 sets - 10 reps - Prone Hip Extension with Pillow Under Abdomen  - 1 x daily - 7 x weekly - 2 sets - 10 reps - 5 hold - Child's Pose Stretch  - 1 x daily - 7 x weekly - 1 sets - 5 reps - 30 hold - Squat with Chair Touch  - 1 x daily - 7 x weekly - 2 sets - 10 reps - Supine 90/90 Alternating  Heel Touches with Posterior Pelvic Tilt  - 1 x daily - 7 x weekly - 2 sets - 10 reps - 5 hold - Bird Dog  - 1 x daily - 7 x weekly - 2 sets - 10 reps - 5 hold  Patient Education - Rolling From Your Back to Your Side   ASSESSMENT:   CLINICAL IMPRESSION: Judianne has missed a couple of weeks due to miscommunication and language barrier. She reports having overall no pain but does have a heat coming from right low back, mostly at night. She is independent with grocery shopping and now uses a wagon, reports no difficulty with this task now. Continued with open and closed chain core and LE strength. She did not c/o pain, only fatigue.      OBJECTIVE IMPAIRMENTS decreased activity tolerance, decreased endurance, decreased mobility, difficulty walking, decreased ROM, decreased strength, hypomobility, increased fascial restrictions, impaired flexibility, impaired sensation, improper body mechanics, and pain.    ACTIVITY LIMITATIONS carrying, lifting, bending, sitting, standing, squatting, sleeping, stairs, transfers, bed mobility, dressing, reach over head, and caring for others   PARTICIPATION LIMITATIONS: meal prep, cleaning, laundry, interpersonal relationship, shopping, and community activity   PERSONAL FACTORS Past/current experiences and 1 comorbidity: chronic back pain   are also affecting patient's functional outcome, as well as being newcomer to [place].   REHAB POTENTIAL: Good   CLINICAL DECISION MAKING: Stable/uncomplicated   EVALUATION COMPLEXITY: Low     GOALS:   LONG TERM GOALS: Target date: 09/13/21   Pt will report centralization of pain from LEs for 50% of the time. Baseline: Rt LE pain , LLE numb, tingling  status: improving, very light pain in Rt LE and none in LLE Status 09/04/21: very light pain in RLE comes and goes, no longer every day Goals status: MET   2.  Pt will be able to lift and carry her child with pain < 5/10 most of the time in her low back  Baseline: pain  severe, needs cues to lift and use proper mechanics Status: 08/14/21: has right leg numbness with lifting son, not pain Status: 09/04/21: intermittent light pain with lifting son.  Goal status: MET   3.  Pt will be able stand for light housework (dishes, cooking) with pain  < 5/10 for 15 min or more  Baseline: severe, < 10 min  Status:08/14/21:  no pain with standing ADLs, now pain present with stairs and prolonged sitting.  Status 09/04/21: no pain with stairs , only fatigue in legs; sitting causes increased pain in RLB Goal status: ONGOING  4.  Pt will improve  LE strength to 4+/5 or more in LEs  Baseline: 4-/5 Status 08/14/21  5/5 Goal status: MET   5.  Pt will be I with HEP for long term pain mgmt (core, LE, trunk)  Baseline: given on eval  Goal status: ongoing        PLAN: PT FREQUENCY: 2x/week   PT DURATION: 4 weeks   PLANNED INTERVENTIONS: Therapeutic exercises, Therapeutic activity, Neuromuscular re-education, Balance training, Gait training, Patient/Family education, Joint mobilization, Dry Needling, Electrical stimulation, Spinal mobilization, Cryotherapy, Moist heat, Taping, Manual therapy, and Re-evaluation.   PLAN FOR NEXT SESSION: Stabilization extension based is working for her. Positioning.  closed chain strength and core,  check stairs for pain- begin step ups    Hessie Diener, PTA 09/04/21 9:30 AM Phone: [phone] Fax: [phone]

## 2021-09-06 ENCOUNTER — Ambulatory Visit: Payer: Medicaid Other | Admitting: Physical Therapy

## 2021-09-06 ENCOUNTER — Encounter: Payer: Self-pay | Admitting: Physical Therapy

## 2021-09-06 DIAGNOSIS — M5416 Radiculopathy, lumbar region: Secondary | ICD-10-CM | POA: Diagnosis not present

## 2021-09-06 DIAGNOSIS — M6281 Muscle weakness (generalized): Secondary | ICD-10-CM

## 2021-09-06 NOTE — Therapy (Signed)
OUTPATIENT PHYSICAL THERAPY TREATMENT NOTE   Patient Name: Stacy Conner MRN: [id number] DOB:July 30, 1970, 51 y.o., female Today's Date: 09/06/2021  PCP: Bo Merino, NP   REFERRING PROVIDER: Stanton Kidney Persons, PA    END OF SESSION:   PT End of Session - 09/06/21 0847     Visit Number 11    Number of Visits 17    Date for PT Re-Evaluation 09/13/21    Authorization Type [employer] - no auth for first 12 visits    PT Start Time 0845    PT Stop Time 0930    PT Time Calculation (min) 45 min                History reviewed. No pertinent past medical history. Past Surgical History:  Procedure Laterality Date   NO PAST SURGERIES     Patient Active Problem List   Diagnosis Date Noted   Low back pain 07/22/2021    REFERRING DIAG:    Low back pain with sciatica, sciatica laterality unspecified, unspecified back pain laterality, unspecified chronicity     THERAPY DIAG:  Radiculopathy, lumbar region  Muscle weakness (generalized)  Rationale for Evaluation and Treatment Rehabilitation  PRECAUTIONS: None   SUBJECTIVE:  Patient reports no pain. She does report heat that comes from Right low back mostly at night.      PAIN:  Are you having pain? Yes: NPRS scale:  0/10 Pain location: low back, Rt LE from hip to foot/toes.  Pain description: sharp, severe , burning  Aggravating factors: bending forward, lifting  Relieving factors: medication, massage   OBJECTIVE: (objective measures completed at initial evaluation unless otherwise dated)   DIAGNOSTIC FINDINGS:  None at this time    PATIENT SURVEYS:  NT due to language barrier   SCREENING FOR RED FLAGS: Bowel or bladder incontinence: No Spinal tumors: No Cauda equina syndrome: No Compression fracture: No Abdominal aneurysm: No   COGNITION:           Overall cognitive status: Within functional limits for tasks assessed                          SENSATION: LLE numb and tingling to foot       POSTURE: rounded shoulders, forward head, flexed trunk , and decr WB on Rt    PALPATION: Not tolerated to bilateral posterior legs , lumbar   LUMBAR ROM:    Active  A/PROM  eval AROM 08/09/21 AROM  08/16/21  Flexion 50% pain   distal shin p! WFL with stretch no pain   Extension 75% pain  75%  p! WFL with slight pain   Right lateral flexion   Past knee no pain   Left lateral flexion   Past knee no pain   Right rotation 50% 75% no pain WFL   Left rotation 50% 75% no pain WFL    (Blank rows = not tested)   LOWER EXTREMITY ROM:      Passive  Right eval Left eval  Hip flexion < 90  < 90  Hip extension      Hip abduction      Hip adduction      Hip internal rotation Limited, <20 Limited,  < 20  Hip external rotation 30 30  Knee flexion      Knee extension      Ankle dorsiflexion      Ankle plantarflexion      Ankle inversion  Ankle eversion       (Blank rows = not tested)   LOWER EXTREMITY MMT:     MMT Right eval Left eval Right 08/14/21 Left 08/14/21  Hip flexion _0 Hip extension        Hip abduction     5 5  Hip adduction        Hip internal rotation        Hip external rotation        Knee flexion 4- 4- 5 5  Knee extension 4- 4- 5 5  Ankle dorsiflexion 4- 4-    Ankle plantarflexion        Ankle inversion        Ankle eversion         (Blank rows = not tested)   LUMBAR SPECIAL TESTS:  Straight leg raise test: Positive and Slump test: Positive     GAIT: Distance walked: 75 Assistive device utilized:  none Level of assistance: Modified independence Comments: slow, mild limp        TODAY'S TREATMENT  [facility] Adult PT Treatment:                                                DATE: 09/06/21 Therapeutic Exercise: Nustep L6 UE/LE x 5 min 7# palloff press  with rotation 5 x 2 each Standing springboard : extension x 15 , high row x 15  Sit to stand x 15 with 15 lb KB at chest height  Bridge with feet on dyna disc 5 sec x 10 , with clam AROM x  10 Supine 90/90 hands under tailbone , toe taps alt.  2 x 10  Step ups 8 inch 10 x 2 with opp knee drive  Childs pose to cat/Camel   [facility] Adult PT Treatment:                                                DATE: 09/04/21 Therapeutic Exercise: Nustep L5 UE/LE x 5 min 7# palloff press x 10 x 2 each Standing springboard : extension x 15 , high row x 15  Sit to stand x 15 with 15 lb KB at chest height  Bridge with feet on dyna disc 5 sec x 10 , with clam AROM x 10 Supine 90/90 hands under tailbone , toe taps alt.  2 x 10  Instucted in green band HEP for Row, ext and paloff press  [facility] Adult PT Treatment:                                                DATE: 08/16/21 Therapeutic Exercise: Squat edge of mat x 10  Sit to stand x 10 with 10 lbs chest press  Supine 90/90 hands under tailbone , toe taps alt.  2 x 10  Bridge x 10  Bridge with feet on dynadisc , clam Monster plank 3 x 15 sec-20 sec  NO PAIN Childs pose   Bird dog x 8 each side  Standing springboard : extension x 10 , high row x 10  Singlel  arm sidefacing shoulder adduction x 10 for core , breathing   [facility] Adult PT Treatment:                                                DATE: 08/14/21 Therapeutic Exercise: MMT Bird dogs Forearm plank  10 sec x 3 from knees - min pain with lowering out of plank Cat/camel to childs pose PPT with knee reach x10 90/90 x 6 , well tolerated today SLR with core focus  2 x 10 , 2#  Row green x 20 Shoulder ext green x 20 Palloff press single green band x 15 each  STS from mat 2 x 15 with 15# KB    PATIENT EDUCATION:  Education details:pain scale, extension, body mechanics to reduce strain  Person educated: Patient Education method: Consulting civil engineer, Media planner, Verbal cues, and Handouts Education comprehension: verbalized understanding, returned demonstration, and needs further education     HOME EXERCISE PROGRAM: Access Code: [email or web address] URL: [email or web address] Date:  09/06/2021 Prepared by: Hessie Diener  Exercises - Bridge  - 1 x daily - 7 x weekly - 2 sets - 10 reps - Prone Hip Extension with Pillow Under Abdomen  - 1 x daily - 7 x weekly - 2 sets - 10 reps - 5 hold - Child's Pose Stretch  - 1 x daily - 7 x weekly - 1 sets - 5 reps - 30 hold - Squat with Chair Touch  - 1 x daily - 7 x weekly - 2 sets - 10 reps - Supine 90/90 Alternating Heel Touches with Posterior Pelvic Tilt  - 1 x daily - 7 x weekly - 2 sets - 10 reps - 5 hold - Bird Dog  - 1 x daily - 7 x weekly - 2 sets - 10 reps - 5 hold - Standing Shoulder Row with Anchored Resistance  - 1 x daily - 7 x weekly - 2 sets - 10 reps - Shoulder extension with resistance - Neutral  - 1 x daily - 7 x weekly - 2 sets - 10 reps - Standing Anti-Rotation Press with Anchored Resistance  - 1 x daily - 7 x weekly - 2 sets - 10 reps  Patient Education - Rolling From Your Back to Your Side   ASSESSMENT:   CLINICAL IMPRESSION: Kemoni reports continued improvement. She has slight pain with stairs at home. Pain was not reproduced on standard 6 inch clinic stairs however she did demonstrate increased pain with 8 inch step ups. Level of pain still mild per her report. Continued with core and hip strength. She has met nearly all goals and we began discharge planning. She has trouble with transportation and prefers to continue HEP.     OBJECTIVE IMPAIRMENTS decreased activity tolerance, decreased endurance, decreased mobility, difficulty walking, decreased ROM, decreased strength, hypomobility, increased fascial restrictions, impaired flexibility, impaired sensation, improper body mechanics, and pain.    ACTIVITY LIMITATIONS carrying, lifting, bending, sitting, standing, squatting, sleeping, stairs, transfers, bed mobility, dressing, reach over head, and caring for others   PARTICIPATION LIMITATIONS: meal prep, cleaning, laundry, interpersonal relationship, shopping, and community activity   PERSONAL FACTORS  Past/current experiences and 1 comorbidity: chronic back pain   are also affecting patient's functional outcome, as well as being newcomer to [place].   REHAB POTENTIAL: Good   CLINICAL DECISION MAKING: Stable/uncomplicated   EVALUATION COMPLEXITY: Low  GOALS:   LONG TERM GOALS: Target date: 09/13/21   Pt will report centralization of pain from LEs for 50% of the time. Baseline: Rt LE pain , LLE numb, tingling  status: improving, very light pain in Rt LE and none in LLE Status 09/04/21: very light pain in RLE comes and goes, no longer every day Goals status: MET   2.  Pt will be able to lift and carry her child with pain < 5/10 most of the time in her low back  Baseline: pain severe, needs cues to lift and use proper mechanics Status: 08/14/21: has right leg numbness with lifting son, not pain Status: 09/04/21: intermittent light pain with lifting son.  Goal status: MET   3.  Pt will be able stand for light housework (dishes, cooking) with pain  < 5/10 for 15 min or more  Baseline: severe, < 10 min  Status:08/14/21:  no pain with standing ADLs, now pain present with stairs and prolonged sitting.  Status 09/04/21: no pain with stairs , only fatigue in legs; sitting causes increased pain in RLB Goal status: ONGOING  4.  Pt will improve LE strength to 4+/5 or more in LEs  Baseline: 4-/5 Status 08/14/21  5/5 Goal status: MET   5.  Pt will be I with HEP for long term pain mgmt (core, LE, trunk)  Baseline: given on eval  Goal status: ongoing        PLAN: PT FREQUENCY: 2x/week   PT DURATION: 4 weeks   PLANNED INTERVENTIONS: Therapeutic exercises, Therapeutic activity, Neuromuscular re-education, Balance training, Gait training, Patient/Family education, Joint mobilization, Dry Needling, Electrical stimulation, Spinal mobilization, Cryotherapy, Moist heat, Taping, Manual therapy, and Re-evaluation.   PLAN FOR NEXT SESSION: check goals and discharge to [place],  PTA 09/06/21 9:51 AM Phone: [phone] Fax: [phone]

## 2021-09-11 ENCOUNTER — Ambulatory Visit: Payer: Medicaid Other | Admitting: Physical Therapy

## 2021-09-11 ENCOUNTER — Encounter: Payer: Self-pay | Admitting: Physical Therapy

## 2021-09-11 DIAGNOSIS — M6281 Muscle weakness (generalized): Secondary | ICD-10-CM

## 2021-09-11 DIAGNOSIS — M5416 Radiculopathy, lumbar region: Secondary | ICD-10-CM

## 2021-09-11 NOTE — Therapy (Addendum)
OUTPATIENT PHYSICAL THERAPY TREATMENT NOTE DISCHARGE   Patient Name: Stacy Conner MRN: [id number] DOB:1970-09-01, 51 y.o., female Today's Date: 09/11/2021  PCP: Bo Merino, NP   REFERRING PROVIDER: Stanton Kidney Persons, PA    END OF SESSION:   PT End of Session - 09/11/21 0855     Visit Number 12    Number of Visits 17    Date for PT Re-Evaluation 09/13/21    Authorization Type [employer] - no auth for first 12 visits    PT Start Time 0845    PT Stop Time 0925    PT Time Calculation (min) 40 min                History reviewed. No pertinent past medical history. Past Surgical History:  Procedure Laterality Date   NO PAST SURGERIES     Patient Active Problem List   Diagnosis Date Noted   Low back pain 07/22/2021    REFERRING DIAG:    Low back pain with sciatica, sciatica laterality unspecified, unspecified back pain laterality, unspecified chronicity     THERAPY DIAG:  Radiculopathy, lumbar region  Muscle weakness (generalized)  Rationale for Evaluation and Treatment Rehabilitation  PRECAUTIONS: None   SUBJECTIVE:  Patient reports no pain. She does report a feeling of heat in her back sometimes with prolonged sitting.      PAIN:  Are you having pain? Yes: NPRS scale:  0/10 Pain location: low back Pain description: sharp, severe , burning  Aggravating factors: bending forward, lifting  Relieving factors: medication, massage   OBJECTIVE: (objective measures completed at initial evaluation unless otherwise dated)   DIAGNOSTIC FINDINGS:  None at this time    PATIENT SURVEYS:  NT due to language barrier   SCREENING FOR RED FLAGS: Bowel or bladder incontinence: No Spinal tumors: No Cauda equina syndrome: No Compression fracture: No Abdominal aneurysm: No   COGNITION:           Overall cognitive status: Within functional limits for tasks assessed                          SENSATION: LLE numb and tingling to foot      POSTURE:  rounded shoulders, forward head, flexed trunk , and decr WB on Rt    PALPATION: Not tolerated to bilateral posterior legs , lumbar   LUMBAR ROM:    Active  A/PROM  eval AROM 08/09/21 AROM  08/16/21  Flexion 50% pain   distal shin p! WFL with stretch no pain   Extension 75% pain  75%  p! WFL with slight pain   Right lateral flexion   Past knee no pain   Left lateral flexion   Past knee no pain   Right rotation 50% 75% no pain WFL   Left rotation 50% 75% no pain WFL    (Blank rows = not tested)   LOWER EXTREMITY ROM:      Passive  Right eval Left eval R/L 09/11/21  Hip flexion < 90  < 90 WFL/WFL  Hip extension       Hip abduction       Hip adduction       Hip internal rotation Limited, <20 Limited,  < 20   Hip external rotation 30 30   Knee flexion       Knee extension       Ankle dorsiflexion       Ankle plantarflexion  Ankle inversion       Ankle eversion        (Blank rows = not tested)   LOWER EXTREMITY MMT:     MMT Right eval Left eval Right 08/14/21 Left 08/14/21  Hip flexion 4 4 5 5   Hip extension        Hip abduction     5 5  Hip adduction        Hip internal rotation        Hip external rotation        Knee flexion 4- 4- 5 5  Knee extension 4- 4- 5 5  Ankle dorsiflexion 4- 4-    Ankle plantarflexion        Ankle inversion        Ankle eversion         (Blank rows = not tested)   LUMBAR SPECIAL TESTS:  Straight leg raise test: Positive and Slump test: Positive     GAIT: Distance walked: 75 Assistive device utilized:  none Level of assistance: Modified independence Comments: slow, mild limp        TODAY'S TREATMENT  [facility] Adult PT Treatment:                                                DATE: 09/11/21 Therapeutic Exercise: Nustep L6 UE/LE x 5 min 7# palloff press  with rotation 10 x 1 each :Blue band extension x 15 , low row x 15  Sit to stand 10 x 2  with 15 lb KB at chest height  Bridge with feet on dyna disc 5 sec x 10 , with clam  AROM x 10 Supine 90/90 hands under tailbone , toe taps alt.  2 x 10  Step ups 8 inch 10 x 2 with opp knee drive  Childs pose to cat/Camel  [facility] Adult PT Treatment:                                                DATE: 09/06/21 Therapeutic Exercise: Nustep L6 UE/LE x 5 min 7# palloff press  with rotation 5 x 2 each Standing springboard : extension x 15 , high row x 15  Sit to stand x 15 with 15 lb KB at chest height  Bridge with feet on dyna disc 5 sec x 10 , with clam AROM x 10 Supine 90/90 hands under tailbone , toe taps alt.  2 x 10  Step ups 8 inch 10 x 2 with opp knee drive  Childs pose to cat/Camel   [facility] Adult PT Treatment:                                                DATE: 09/04/21 Therapeutic Exercise: Nustep L5 UE/LE x 5 min 7# palloff press x 10 x 2 each Standing springboard : extension x 15 , high row x 15  Sit to stand x 15 with 15 lb KB at chest height  Bridge with feet on dyna disc 5 sec x 10 , with clam AROM x 10 Supine 90/90 hands under tailbone ,  toe taps alt.  2 x 10  Instucted in green band HEP for Row, ext and paloff press  [facility] Adult PT Treatment:                                                DATE: 08/16/21 Therapeutic Exercise: Squat edge of mat x 10  Sit to stand x 10 with 10 lbs chest press  Supine 90/90 hands under tailbone , toe taps alt.  2 x 10  Bridge x 10  Bridge with feet on dynadisc , clam Monster plank 3 x 15 sec-20 sec  NO PAIN Childs pose   Bird dog x 8 each side  Standing springboard : extension x 10 , high row x 10  Singlel arm sidefacing shoulder adduction x 10 for core , breathing   [facility] Adult PT Treatment:                                                DATE: 08/14/21 Therapeutic Exercise: MMT Bird dogs Forearm plank  10 sec x 3 from knees - min pain with lowering out of plank Cat/camel to childs pose PPT with knee reach x10 90/90 x 6 , well tolerated today SLR with core focus  2 x 10 , 2#  Row green x 20 Shoulder ext green x  20 Palloff press single green band x 15 each  STS from mat 2 x 15 with 15# KB    PATIENT EDUCATION:  Education details:pain scale, extension, body mechanics to reduce strain  Person educated: Patient Education method: Consulting civil engineer, Media planner, Verbal cues, and Handouts Education comprehension: verbalized understanding, returned demonstration, and needs further education     HOME EXERCISE PROGRAM: Access Code: [email or web address] URL: [email or web address] Date: 09/06/2021 Prepared by: Hessie Diener  Exercises - Bridge  - 1 x daily - 7 x weekly - 2 sets - 10 reps - Prone Hip Extension with Pillow Under Abdomen  - 1 x daily - 7 x weekly - 2 sets - 10 reps - 5 hold - Child's Pose Stretch  - 1 x daily - 7 x weekly - 1 sets - 5 reps - 30 hold - Squat with Chair Touch  - 1 x daily - 7 x weekly - 2 sets - 10 reps - Supine 90/90 Alternating Heel Touches with Posterior Pelvic Tilt  - 1 x daily - 7 x weekly - 2 sets - 10 reps - 5 hold - Bird Dog  - 1 x daily - 7 x weekly - 2 sets - 10 reps - 5 hold - Standing Shoulder Row with Anchored Resistance  - 1 x daily - 7 x weekly - 2 sets - 10 reps - Shoulder extension with resistance - Neutral  - 1 x daily - 7 x weekly - 2 sets - 10 reps - Standing Anti-Rotation Press with Anchored Resistance  - 1 x daily - 7 x weekly - 2 sets - 10 reps  Patient Education - Rolling From Your Back to Your Side   ASSESSMENT:   CLINICAL IMPRESSION: Stacy Conner has made great progress with PT and has met all goals. She reports leg pain has almost fully resolved and she only has pain in her back described as "heat"  when sitting too long. She is pleased with her current function and is appropriate for discharge to HEP. Review of HEP performed today and she demonstrates independence.      OBJECTIVE IMPAIRMENTS decreased activity tolerance, decreased endurance, decreased mobility, difficulty walking, decreased ROM, decreased strength, hypomobility, increased fascial  restrictions, impaired flexibility, impaired sensation, improper body mechanics, and pain.    ACTIVITY LIMITATIONS carrying, lifting, bending, sitting, standing, squatting, sleeping, stairs, transfers, bed mobility, dressing, reach over head, and caring for others   PARTICIPATION LIMITATIONS: meal prep, cleaning, laundry, interpersonal relationship, shopping, and community activity   PERSONAL FACTORS Past/current experiences and 1 comorbidity: chronic back pain   are also affecting patient's functional outcome, as well as being newcomer to [place].   REHAB POTENTIAL: Good   CLINICAL DECISION MAKING: Stable/uncomplicated   EVALUATION COMPLEXITY: Low     GOALS:   LONG TERM GOALS: Target date: 09/13/21   Pt will report centralization of pain from LEs for 50% of the time. Baseline: Rt LE pain , LLE numb, tingling  status: improving, very light pain in Rt LE and none in LLE Status 09/04/21: very light pain in RLE comes and goes, no longer every day Goals status: MET   2.  Pt will be able to lift and carry her child with pain < 5/10 most of the time in her low back  Baseline: pain severe, needs cues to lift and use proper mechanics Status: 08/14/21: has right leg numbness with lifting son, not pain Status: 09/04/21: intermittent light pain with lifting son.  Goal status: MET   3.  Pt will be able stand for light housework (dishes, cooking) with pain  < 5/10 for 15 min or more  Baseline: severe, < 10 min  Status:08/14/21:  no pain with standing ADLs, now pain present with stairs and prolonged sitting.  Status 09/04/21: no pain with stairs , only fatigue in legs; sitting causes increased pain in RLB Goal status: MET  4.  Pt will improve LE strength to 4+/5 or more in LEs  Baseline: 4-/5 Status 08/14/21  5/5 Goal status: MET   5.  Pt will be I with HEP for long term pain mgmt (core, LE, trunk)  Baseline: given on eval  Goal status: MET       PLAN: PT FREQUENCY: 2x/week   PT DURATION: 4  weeks   PLANNED INTERVENTIONS: Therapeutic exercises, Therapeutic activity, Neuromuscular re-education, Balance training, Gait training, Patient/Family education, Joint mobilization, Dry Needling, Electrical stimulation, Spinal mobilization, Cryotherapy, Moist heat, Taping, Manual therapy, and Re-evaluation.   PLAN FOR NEXT SESSION: discharge to HEP today  Hessie Diener, PTA 09/11/21 8:55 AM Phone: [phone] Fax: [phone]    PHYSICAL THERAPY DISCHARGE SUMMARY  Visits from Start of Care: 12  Current functional level related to goals / functional outcomes: See above    Remaining deficits: None limiting function   Education / Equipment: HEP, posture, lifting    Patient agrees to discharge. Patient goals were met. Patient is being discharged due to being pleased with the current functional level.  Raeford Razor, PT 09/16/21 1:03 PM Phone: [phone] Fax: [phone]

## 2021-09-13 ENCOUNTER — Ambulatory Visit: Payer: Medicaid Other | Admitting: Physical Therapy

## 2021-09-18 ENCOUNTER — Encounter: Payer: Medicaid Other | Admitting: Physical Therapy

## 2021-09-20 ENCOUNTER — Encounter: Payer: Medicaid Other | Admitting: Physical Therapy

## 2021-10-08 ENCOUNTER — Ambulatory Visit: Payer: Medicaid Other | Admitting: Obstetrics and Gynecology

## 2021-10-14 ENCOUNTER — Encounter: Payer: Self-pay | Admitting: Obstetrics and Gynecology

## 2021-10-14 ENCOUNTER — Ambulatory Visit (INDEPENDENT_AMBULATORY_CARE_PROVIDER_SITE_OTHER): Payer: Medicaid Other | Admitting: Obstetrics and Gynecology

## 2021-10-14 VITALS — BP 125/81 | HR 84 | Wt 162.0 lb

## 2021-10-14 DIAGNOSIS — Z3169 Encounter for other general counseling and advice on procreation: Secondary | ICD-10-CM

## 2021-10-14 MED ORDER — VITAFOL ULTRA 29-0.6-0.4-200 MG PO CAPS: 1.0000 | ORAL_CAPSULE | Freq: Every day | ORAL | 12 refills | Status: DC

## 2021-10-14 NOTE — Progress Notes (Signed)
Pt is in office to discuss conception. Pt states her cycles are regular.

## 2021-10-14 NOTE — Progress Notes (Signed)
51 yo P3 with LMP 10/14/21 and BMI 29 presenting today for fertility counseling. Patient reports a monthly period lasting 5 days. She states that her last child who is 49 years old was conceived after missing a few birth control pills. The same is true for her other 2 children. Patient was hoping to receive a birth control pill prescription to help with conception. Patient is without any other complaints  No past medical history on file. Past Surgical History:  Procedure Laterality Date   NO PAST SURGERIES     Family History  Family history unknown: Yes   Social History   Tobacco Use   Smoking status: Never   Smokeless tobacco: Never  Vaping Use   Vaping Use: Never used  Substance Use Topics   Alcohol use: Never   Drug use: Never   ROS See pertinent in HPI. All other systems reviewed and non contributory Blood pressure 125/81, pulse 84, weight 162 lb (73.5 kg), last menstrual period 10/14/2021. GENERAL: Well-developed, well-nourished female in no acute distress.  NEURO: alert and oriented x 3  A/P 51 yo P3 seeking fertility - Reviewed timing of intercourse with ovulation - Advised patient to take prenatal vitamins - Discussed AMA and high risk of miscarriage and pregnancy - Discussed referral to infertility specialist which patient declined - RTC in 6 months if no conception

## 2021-11-22 ENCOUNTER — Ambulatory Visit: Payer: Medicaid Other | Admitting: Nurse Practitioner

## 2022-01-19 ENCOUNTER — Encounter (HOSPITAL_COMMUNITY): Payer: Self-pay

## 2022-01-19 ENCOUNTER — Ambulatory Visit (HOSPITAL_COMMUNITY)
Admission: EM | Admit: 2022-01-19 | Discharge: 2022-01-19 | Disposition: A | Discharge status: Home / Self Care | Payer: Medicaid Other | Attending: Emergency Medicine | Admitting: Emergency Medicine

## 2022-01-19 DIAGNOSIS — B349 Viral infection, unspecified: Secondary | ICD-10-CM | POA: Insufficient documentation

## 2022-01-19 DIAGNOSIS — Z1152 Encounter for screening for COVID-19: Secondary | ICD-10-CM | POA: Diagnosis not present

## 2022-01-19 DIAGNOSIS — K529 Noninfective gastroenteritis and colitis, unspecified: Secondary | ICD-10-CM | POA: Insufficient documentation

## 2022-01-19 LAB — RESP PANEL BY RT-PCR (FLU A&B, COVID) ARPGX2
Influenza A by PCR: NEGATIVE
Influenza B by PCR: NEGATIVE
SARS Coronavirus 2 by RT PCR: NEGATIVE

## 2022-01-19 LAB — POCT URINALYSIS DIPSTICK, ED / UC
Bilirubin Urine: NEGATIVE
Glucose, UA: NEGATIVE mg/dL
Ketones, ur: NEGATIVE mg/dL
Leukocytes,Ua: NEGATIVE
Nitrite: NEGATIVE
Protein, ur: NEGATIVE mg/dL
Specific Gravity, Urine: <=1.005 (ref 1.005–1.030)
Urobilinogen, UA: 0.2 mg/dL (ref 0.0–1.0)
pH: 6 (ref 5.0–8.0)

## 2022-01-19 MED ORDER — ONDANSETRON 4 MG PO TBDP
4.0000 mg | ORAL_TABLET | Freq: Once | ORAL | Status: AC
  Administered 2022-01-19: 4 mg via ORAL

## 2022-01-19 MED ORDER — ONDANSETRON 4 MG PO TBDP
ORAL_TABLET | ORAL | Status: AC
  Filled 2022-01-19: qty 1

## 2022-01-19 MED ORDER — ONDANSETRON HCL 4 MG PO TABS: 4.0000 mg | ORAL_TABLET | Freq: Four times a day (QID) | ORAL | 0 refills | Status: AC

## 2022-01-19 NOTE — ED Triage Notes (Signed)
Pt is here for abdominal pain, low energy, vomiting , throat feels dry , headache, pain in the eyes, dizziness, bilateral ear pain, back pain , no appetite x1 day

## 2022-01-19 NOTE — Discharge Instructions (Signed)
Use the nausea medicine every 6 hours as needed Make sure you are drinking lots of water  Monitor symptoms and Please go to the emergency department if symptoms worsen.  We will call you if your covid/flu test returns positive.

## 2022-01-19 NOTE — ED Provider Notes (Signed)
[facility]    CSN: [id number] Arrival date & time: 01/19/22  1052      History   Chief Complaint Chief Complaint  Patient presents with   Abdominal Pain   Sore Throat   Otalgia    HPI Stacy Conner is a 51 y.o. female.  Medical interpretor used for this encounter  Presents with abdominal pain that began yesterday, some body aches and sore throat Few episodes of vomiting last night. She has been able to tolerate fluids by mouth. Denies diarrhea. Normal BMs  Some burning with urination that began yesterday as well. No hematuria. No flank pain. Denies fever. Denies congestion or cough.  No known sick contacts but son is in school Has not taken medications  History reviewed. No pertinent past medical history.  Patient Active Problem List   Diagnosis Date Noted   Low back pain 07/22/2021    Past Surgical History:  Procedure Laterality Date   NO PAST SURGERIES      OB History     Gravida  3   Para  3   Term  3   Preterm      AB      Living  3      SAB      IAB      Ectopic      Multiple      Live Births               Home Medications    Prior to Admission medications   Medication Sig Start Date End Date Taking? Authorizing Provider  ondansetron (ZOFRAN) 4 MG tablet Take 1 tablet (4 mg total) by mouth every 6 (six) hours for 3 days. 01/19/22 01/22/22 Yes Reily Ilic, Lurena Joiner, PA-C  fluticasone (FLONASE) 50 MCG/ACT nasal spray Place 2 sprays into both nostrils daily. 08/09/21   Zenia Resides, MD  Prenat-Fe Poly-Methfol-FA-DHA (VITAFOL ULTRA) 29-0.6-0.4-200 MG CAPS Take 1 tablet by mouth daily. 10/14/21   Constant, Peggy, MD    Family History Family History  Family history unknown: Yes    Social History Social History   Tobacco Use   Smoking status: Never   Smokeless tobacco: Never  Vaping Use   Vaping Use: Never used  Substance Use Topics   Alcohol use: Never   Drug use: Never     Allergies    Penicillins   Review of Systems Review of Systems  Per HPI  Physical Exam Triage Vital Signs ED Triage Vitals  Enc Vitals Group     BP 01/19/22 1332 139/81     Pulse Rate 01/19/22 1332 86     Resp 01/19/22 1332 16     Temp 01/19/22 1332 98.6 F (37 C)     Temp Source 01/19/22 1332 Oral     SpO2 01/19/22 1332 100 %     Weight --      Height --      Head Circumference --      Peak Flow --      Pain Score 01/19/22 1337 10     Pain Loc --      Pain Edu? --      Excl. in GC? --    No data found.  Updated Vital Signs BP 139/81 (BP Location: Left Arm)   Pulse 86   Temp 98.6 F (37 C) (Oral)   Resp 16   LMP 01/02/2022   SpO2 100%    Physical Exam Vitals and nursing note reviewed.  Constitutional:      General: She is not in acute distress.    Appearance: Normal appearance.  HENT:     Nose: No congestion or rhinorrhea.     Mouth/Throat:     Mouth: Mucous membranes are moist.     Pharynx: Oropharynx is clear. No posterior oropharyngeal erythema.     Tonsils: No tonsillar exudate.  Eyes:     Conjunctiva/sclera: Conjunctivae normal.  Cardiovascular:     Rate and Rhythm: Normal rate and regular rhythm.     Heart sounds: Normal heart sounds.  Pulmonary:     Effort: Pulmonary effort is normal. No respiratory distress.     Breath sounds: Normal breath sounds.  Abdominal:     General: Bowel sounds are normal.     Palpations: Abdomen is soft.     Tenderness: There is abdominal tenderness. There is no right CVA tenderness, left CVA tenderness, guarding or rebound.     Comments: Generalized pain with palpation. No point tenderness. No guarding   Musculoskeletal:        General: Normal range of motion.  Skin:    General: Skin is warm and dry.  Neurological:     Mental Status: She is alert and oriented to person, place, and time.     UC Treatments / Results  Labs (all labs ordered are listed, but only abnormal results are displayed) Labs Reviewed  POCT  URINALYSIS DIPSTICK, ED / UC - Abnormal; Notable for the following components:      Result Value   Hgb urine dipstick TRACE (*)    All other components within normal limits  RESP PANEL BY RT-PCR (FLU A&B, COVID) [id number]    EKG   Radiology No results found.  Procedures Procedures (including critical care time)  Medications Ordered in UC Medications  ondansetron (ZOFRAN-ODT) disintegrating tablet 4 mg (4 mg Oral Given 01/19/22 1417)    Initial Impression / Assessment and Plan / UC Course  I have reviewed the triage vital signs and the nursing notes.  Pertinent labs & imaging results that were available during my care of the patient were reviewed by me and considered in my medical decision making (see chart for details).  Zofran dose given for nausea with improvement. Urinalysis with trace hgb. No sign of infection. Covid/flu pending.  Candidate for antivirals if positive. Last GFR 100 May be gastroenteritis vs flu Recommend zofran every 6 hours as needed, increasing fluid intake, monitoring for change in symptoms. ED precautions discussed. Patient agrees to plan  Final Clinical Impressions(s) / UC Diagnoses   Final diagnoses:  Viral illness  Gastroenteritis     Discharge Instructions      Use the nausea medicine every 6 hours as needed Make sure you are drinking lots of water  Monitor symptoms and Please go to the emergency department if symptoms worsen.  We will call you if your covid/flu test returns positive.        ED Prescriptions     Medication Sig Dispense Auth. Provider   ondansetron (ZOFRAN) 4 MG tablet Take 1 tablet (4 mg total) by mouth every 6 (six) hours for 3 days. 12 tablet Shanyn Preisler, Lurena Joiner, PA-C      PDMP not reviewed this encounter.   Donna Silverman, Lurena Joiner, [place] 01/19/22 1443

## 2022-01-21 NOTE — Congregational Nurse Program (Signed)
  Dept: [phone]   Congregational Nurse Program Note  Date of Encounter: 01/21/2022  Past Medical History: No past medical history on file.  Encounter Details:  CNP Questionnaire - 01/21/22 1157       Questionnaire   Student Assistance N/A    Location Patient Served  [facility]    Visit Setting with Client Organization    Patient Status Unknown    Insurance Uninsured ([employer]/Self-Pay/Medicaid Family Planning)    Insurance/Financial Assistance Referral Medicaid    Medication Have Medication Insecurities    Medical Provider Yes    Screening Referrals Made N/A    Medical Referrals Made N/A    Medical Appointment Made N/A    Recently w/o PCP, now 1st time PCP visit completed due to CNs referral or appointment made N/A    Food N/A    Transportation N/A    Housing/Utilities N/A    Interpersonal Safety N/A    Interventions Advocate/Support;Navigate Healthcare System;Case Management;Counsel;Educate;Spiritual Care    Abnormal to Normal Screening Since Last CN Visit Blood Pressure    Screenings CN Performed Blood Pressure;Blood Glucose    Sent Client to Lab for: N/A    Did client attend any of the following based off CNs referral or appointments made? N/A    ED Visit Averted Yes    Life-Saving Intervention Made N/A            Patient came in for follow up after urgent care visit, she was diagnosed with a viral infection and sent home on zofran. She still c/o nausea without vomiting. Encouraged to continue using zofran and stay hydrated. I have advised her to test for pregnancy at home as well.  Nicole Cella Nasiyah Laverdiere RN BSN PCCN  [facility] [phone]

## 2022-01-25 ENCOUNTER — Other Ambulatory Visit: Payer: Self-pay

## 2022-01-25 ENCOUNTER — Ambulatory Visit (HOSPITAL_COMMUNITY)
Admission: EM | Admit: 2022-01-25 | Discharge: 2022-01-25 | Disposition: A | Discharge status: Home / Self Care | Payer: Medicaid Other

## 2022-01-25 ENCOUNTER — Encounter (HOSPITAL_COMMUNITY): Payer: Self-pay | Admitting: *Deleted

## 2022-01-25 DIAGNOSIS — Z1152 Encounter for screening for COVID-19: Secondary | ICD-10-CM | POA: Diagnosis present

## 2022-01-25 DIAGNOSIS — J111 Influenza due to unidentified influenza virus with other respiratory manifestations: Secondary | ICD-10-CM | POA: Diagnosis not present

## 2022-01-25 DIAGNOSIS — U071 COVID-19: Secondary | ICD-10-CM | POA: Diagnosis not present

## 2022-01-25 DIAGNOSIS — N912 Amenorrhea, unspecified: Secondary | ICD-10-CM | POA: Diagnosis not present

## 2022-01-25 DIAGNOSIS — J069 Acute upper respiratory infection, unspecified: Secondary | ICD-10-CM | POA: Diagnosis present

## 2022-01-25 DIAGNOSIS — H6503 Acute serous otitis media, bilateral: Secondary | ICD-10-CM

## 2022-01-25 DIAGNOSIS — R509 Fever, unspecified: Secondary | ICD-10-CM

## 2022-01-25 DIAGNOSIS — J029 Acute pharyngitis, unspecified: Secondary | ICD-10-CM | POA: Diagnosis not present

## 2022-01-25 LAB — POCT RAPID STREP A, ED / UC: Streptococcus, Group A Screen (Direct): NEGATIVE

## 2022-01-25 LAB — POC URINE PREG, ED: Preg Test, Ur: NEGATIVE

## 2022-01-25 LAB — SARS CORONAVIRUS 2 (TAT 6-24 HRS): SARS Coronavirus 2: POSITIVE — AB

## 2022-01-25 LAB — POC INFLUENZA A AND B ANTIGEN (URGENT CARE ONLY)
INFLUENZA A ANTIGEN, POC: POSITIVE — AB
INFLUENZA B ANTIGEN, POC: NEGATIVE

## 2022-01-25 MED ORDER — ACETAMINOPHEN 325 MG PO TABS
650.0000 mg | ORAL_TABLET | Freq: Once | ORAL | Status: AC
  Administered 2022-01-25: 650 mg via ORAL

## 2022-01-25 MED ORDER — DM-GUAIFENESIN ER 30-600 MG PO TB12: 1.0000 | ORAL_TABLET | Freq: Two times a day (BID) | ORAL | 0 refills | Status: DC

## 2022-01-25 MED ORDER — AZITHROMYCIN 250 MG PO TABS: ORAL_TABLET | ORAL | 0 refills | Status: DC

## 2022-01-25 MED ORDER — ACETAMINOPHEN 325 MG PO TABS
ORAL_TABLET | ORAL | Status: AC
  Filled 2022-01-25: qty 2

## 2022-01-25 MED ORDER — OSELTAMIVIR PHOSPHATE 75 MG PO CAPS: 75.0000 mg | ORAL_CAPSULE | Freq: Two times a day (BID) | ORAL | 0 refills | Status: DC

## 2022-01-25 NOTE — ED Triage Notes (Addendum)
Via [employer] video interpreter [phone]: C/O sore throat, chills, HA, sinus pain, runny nose, fever onset yesterday. Also describes lymphadenopathy. Denies cough. Has not taken any meds for sxs. Pt was seen 12/3 for gastroenteritis and viral illness with negative resp panel. Also reports no menstrual cycle x 2 months.

## 2022-01-25 NOTE — Discharge Instructions (Addendum)
Advised take Tylenol or ibuprofen as needed for fever or discomfort. Advised to take Mucinex DM every 12 hours to control cough and congestion. Advised to take the Zithromax, 2 tablets initially and then 1 daily until completed to treat the ear and throat infection.  Your flu test came back positive for flu A.  Have sent prescription to the pharmacy for Tamiflu 75 mg, 1 tablet twice daily until completed as this will help treat the flu.  Advised to follow-up with PCP or consult women's health.  Conservative delayed periods.   Advised follow-up PCP or return to urgent care if symptoms fail to improve.

## 2022-01-25 NOTE — ED Provider Notes (Signed)
[facility]    CSN: [id number] Arrival date & time: 01/25/22  1001      History   Chief Complaint Chief Complaint  Patient presents with   Sore Throat   Fever    HPI Stacy Conner is a 51 y.o. female.   51 year old female presents with fever, sore throat.,  Congestion, and amenorrhea for the past 2 months.  History is being taken through interpretive services with Arabic/Sudanese being the language.  Patient indicates for the past 1 to 2 days she has been having increasing upper respiratory congestion with postnasal drip and rhinitis which is mainly been clear.  She relates has been having bilateral ear congestion with pain and discomfort.  She also relates that she has been having sore throat and progressive painful swallowing with fever.  She relates that she has been having chills, sweats and body aches and discomfort.  Patient also indicates that she has been having some mild chest congestion with intermittent cough.  Patient indicates that she was seen about 6 days ago and develops screening for COVID and flu test being negative.  Patient indicates she currently is not taking any medicines to help decrease her symptoms, she has not been around any family or friends that been sick. Patient also indicates that she has not had a period in the past 2 months.   Sore Throat  Fever Associated symptoms: ear pain (both ears) and sore throat     History reviewed. No pertinent past medical history.  Patient Active Problem List   Diagnosis Date Noted   Low back pain 07/22/2021    Past Surgical History:  Procedure Laterality Date   NO PAST SURGERIES      OB History     Gravida  3   Para  3   Term  3   Preterm      AB      Living  3      SAB      IAB      Ectopic      Multiple      Live Births               Home Medications    Prior to Admission medications   Medication Sig Start Date End Date Taking? Authorizing Provider   azithromycin (ZITHROMAX Z-PAK) 250 MG tablet Take 2 tablets initially, then 1 tablet daily until completed medication. 01/25/22  Yes Nyoka Lint, PA-C  dextromethorphan-guaiFENesin [facility] DM) 30-600 MG 12hr tablet Take 1 tablet by mouth 2 (two) times daily. 01/25/22  Yes Nyoka Lint, PA-C  ONDANSETRON HCL PO Take by mouth.   Yes [provider]  oseltamivir (TAMIFLU) 75 MG capsule Take 1 capsule (75 mg total) by mouth every 12 (twelve) hours. 01/25/22  Yes Nyoka Lint, PA-C  fluticasone [facility]) 50 MCG/ACT nasal spray Place 2 sprays into both nostrils daily. 08/09/21   Barrett Henle, MD  Prenat-Fe Poly-Methfol-FA-DHA (VITAFOL ULTRA) 29-0.6-0.4-200 MG CAPS Take 1 tablet by mouth daily. 10/14/21   Constant, Peggy, MD    Family History Family History  Family history unknown: Yes    Social History Social History   Tobacco Use   Smoking status: Never   Smokeless tobacco: Never  Vaping Use   Vaping Use: Never used  Substance Use Topics   Alcohol use: Never   Drug use: Never     Allergies   Penicillins   Review of Systems Review of Systems  Constitutional:  Positive for  fever.  HENT:  Positive for ear pain (both ears), postnasal drip and sore throat.      Physical Exam Triage Vital Signs ED Triage Vitals [01/25/22 1013]  Enc Vitals Group     BP 138/83     Pulse Rate (!) 119     Resp 18     Temp 99.6 F (37.6 C)     Temp Source Oral     SpO2 100 %     Weight      Height      Head Circumference      Peak Flow      Pain Score      Pain Loc      Pain Edu?      Excl. in GC?    No data found.  Updated Vital Signs BP 138/83   Pulse (!) 119   Temp 99.6 F (37.6 C) (Oral)   Resp 18   LMP 12/19/2021 Comment: "spotting only"  SpO2 100%   Visual Acuity Right Eye Distance:   Left Eye Distance:   Bilateral Distance:    Right Eye Near:   Left Eye Near:    Bilateral Near:     Physical Exam Constitutional:      Appearance: She is  well-developed.  HENT:     Right Ear: Ear canal normal.     Left Ear: Ear canal normal. Tympanic membrane is erythematous.     Mouth/Throat:     Mouth: Mucous membranes are moist.     Pharynx: Posterior oropharyngeal erythema present. No oropharyngeal exudate.  Cardiovascular:     Rate and Rhythm: Normal rate and regular rhythm.     Heart sounds: Normal heart sounds.  Pulmonary:     Effort: Pulmonary effort is normal.     Breath sounds: Normal breath sounds and air entry. No wheezing, rhonchi or rales.  Lymphadenopathy:     Cervical: Cervical adenopathy (mild anterior cervical adenopathy present bilat) present.  Neurological:     Mental Status: She is alert.      UC Treatments / Results  Labs (all labs ordered are listed, but only abnormal results are displayed) Labs Reviewed  POC INFLUENZA A AND B ANTIGEN (URGENT CARE ONLY) - Abnormal; Notable for the following components:      Result Value   INFLUENZA A ANTIGEN, POC POSITIVE (*)    All other components within normal limits  SARS CORONAVIRUS 2 (TAT 6-24 HRS)  POCT RAPID STREP A, ED / UC  POC URINE PREG, ED    EKG   Radiology No results found.  Procedures Procedures (including critical care time)  Medications Ordered in UC Medications  acetaminophen (TYLENOL) tablet 650 mg (650 mg Oral Given 01/25/22 1034)    Initial Impression / Assessment and Plan / UC Course  I have reviewed the triage vital signs and the nursing notes.  Pertinent labs & imaging results that were available during my care of the patient were reviewed by me and considered in my medical decision making (see chart for details).    Plan: 1.  The upper respiratory tract infection be treated with the following: A.  Mucinex DM 12 hours to control cough and congestion. 2.  Pharyngitis will be treated with the following: A.  Mucinex DM every 12 hours for cough and congestion. B.  Advised Tylenol for fever or Advil. 3.  The acute otitis media of  both ears will be treated with the following: A.  Zithromax 250 mg, 2 initially and  then 1 daily until completed. 4.  The fever will be treated with the following: A.  Advised patient to take Tylenol or Advil to control fever or discomfort. 5.  Screening for COVID-19 will be treated with the following: A.  Treatment with modified depending on results of COVID test. 6.  The amenorrhea will be treated with the following: A. Observe and patience, follow-up with PCP/GYN. 7.  The flu will be treated with the following: A.  Tamiflu 75 mg twice daily for 5 days only. 7.  Patient advised to follow-up PCP or return to urgent care if symptoms fail to improve. Final Clinical Impressions(s) / UC Diagnoses   Final diagnoses:  Viral upper respiratory tract infection  Pharyngitis, unspecified etiology  Non-recurrent acute serous otitis media of both ears  Fever, unspecified  Encounter for screening for COVID-19  Amenorrhea  Flu     Discharge Instructions      Advised take Tylenol or ibuprofen as needed for fever or discomfort. Advised to take Mucinex DM every 12 hours to control cough and congestion. Advised to take the Zithromax, 2 tablets initially and then 1 daily until completed to treat the ear and throat infection.  Your flu test came back positive for flu A.  Have sent prescription to the pharmacy for Tamiflu 75 mg, 1 tablet twice daily until completed as this will help treat the flu.  Advised to follow-up with PCP or consult women's health.  Conservative delayed periods.   Advised follow-up PCP or return to urgent care if symptoms fail to improve.     ED Prescriptions     Medication Sig Dispense Auth. Provider   dextromethorphan-guaiFENesin (MUCINEX DM) 30-600 MG 12hr tablet Take 1 tablet by mouth 2 (two) times daily. 20 tablet Nyoka Lint, PA-C   azithromycin (ZITHROMAX Z-PAK) 250 MG tablet Take 2 tablets initially, then 1 tablet daily until completed medication. 6 each Nyoka Lint, PA-C   oseltamivir (TAMIFLU) 75 MG capsule Take 1 capsule (75 mg total) by mouth every 12 (twelve) hours. 10 capsule Nyoka Lint, PA-C      PDMP not reviewed this encounter.   Nyoka Lint, PA-C 01/25/22 1109

## 2022-03-18 ENCOUNTER — Encounter (HOSPITAL_COMMUNITY): Payer: Self-pay

## 2022-03-18 ENCOUNTER — Ambulatory Visit (HOSPITAL_COMMUNITY)
Admission: EM | Admit: 2022-03-18 | Discharge: 2022-03-18 | Disposition: A | Discharge status: Home / Self Care | Payer: Medicaid Other | Attending: Internal Medicine | Admitting: Internal Medicine

## 2022-03-18 ENCOUNTER — Ambulatory Visit: Payer: Self-pay | Admitting: Nurse Practitioner

## 2022-03-18 DIAGNOSIS — H1013 Acute atopic conjunctivitis, bilateral: Secondary | ICD-10-CM

## 2022-03-18 DIAGNOSIS — J309 Allergic rhinitis, unspecified: Secondary | ICD-10-CM

## 2022-03-18 MED ORDER — CETIRIZINE HCL 10 MG PO TABS: 10.0000 mg | ORAL_TABLET | Freq: Every day | ORAL | 2 refills | Status: DC

## 2022-03-18 MED ORDER — FLUTICASONE PROPIONATE 50 MCG/ACT NA SUSP: 2.0000 | Freq: Every day | NASAL | 0 refills | Status: DC

## 2022-03-18 MED ORDER — OLOPATADINE HCL 0.1 % OP SOLN: 1.0000 [drp] | Freq: Two times a day (BID) | OPHTHALMIC | 0 refills | Status: DC

## 2022-03-18 MED ORDER — IBUPROFEN 600 MG PO TABS: 600.0000 mg | ORAL_TABLET | Freq: Four times a day (QID) | ORAL | 0 refills | Status: DC | PRN

## 2022-03-18 NOTE — ED Triage Notes (Signed)
Per Interpreter- Patient states she has had a non productive cough, nasal congestion, body rash, ear pain and fullness, and dizziness x 2-3 days.  Patient has not taken any meds for her symptoms.

## 2022-03-18 NOTE — Discharge Instructions (Addendum)
?? ??????? ?? ???? ?????? ???? ???????? ??????? ?? ?????? ??????? ???????.  ????? ????????????   600 ??? ?? 6 ????? ??? ?????? ????? ????? ??????.  ????? ???????? 10 ??? ?????? ?????? ?????? ????? ???????.  ?????? ???? ????? Flonase ?? ?? ???? ??? 1 ???? ?????? ???????? ?? ?????? ????? ???????.  ?????? ????? ????? ??????????? ?? ???? ??????? ?? 12 ???? ??? ?????? ?????? ????? ??????.  ??? ???? ???? ?? ????? ????? ?? ?????? ?? ?? ????? ???? ??????? ?? ??????? ???? ???????? ????? ??????. ??? ???? ?????? ?????? ???? ?????? ??? ???? ???????. ?? ????????? ?? ???? ??????? ??????? ????? ?? ?????? ???? ?? ??????? ?????? ??????? ???????? ??? ???????? ?????? ????????. ????? ?? ???? ?????! min almuhtamal 'an takun 'aeraduk bisabab alhasasiat alnaajimat ean alharakat al'akhirat walghubari. tanawal al'iibubrufin 600 milgh kuli 6 saeat hasab alhajat lalam aljism walamih. tanawal sitrizin 10 milgh ywmyan litajfif ahtiqan al'anf watasrifihi. aistakhdim radhadh al'anf Flonase fi kuli fathat 'anf 1 radhadh ywmyan lilmusaeadat fi aihtiqan al'anf watasrifihi. aistakhdim qatarat aleayn 'uwlubatadin fi kilta aleaynayn kula 12 saeat hasab alhajat litasrif aleayn walhakati. 'iidha zaharat ealayk 'ayu 'aerad jadidat 'aw tafaqamat 'aw lam tatahasan khilal alyawmayn 'aw althalathat 'ayaam alqadimati, fayurjaa aleawdatu. 'iidha kanat 'aeraduk shadidatun, yurjaa aldhahab 'iilaa ghurfat altawarii. qum bialmutabaeat mae muqadam alrieayat al'awaliat alkhasi bik li'iijra' mazid min altaqyim wa'iidarat al'aerad bial'iidafat 'iilaa alziyarat alsihiyat almustamirati. 'atamanaa 'an tasheur bitahasuni!  Your symptoms are likely due to allergies triggered by recent move and dust.  Take ibuprofen 600mg  every 6 hours as needed for body aches and pain.   Take cetrizine 10mg  daily to dry up nasal congestion and drainage.  Use flonase nasal spray to each nostril 1 spray daily to help with nasal congestion and drainage.  Use olopatadine  eye drops to both eyes every 12 hours as needed for eye drainage and itching.  If you develop any new or worsening symptoms or do not improve in the next 2 to 3 days, please return.  If your symptoms are severe, please go to the emergency room.  Follow-up with your primary care provider for further evaluation and management of your symptoms as well as ongoing wellness visits.  I hope you feel better!

## 2022-03-18 NOTE — ED Provider Notes (Signed)
[facility]    CSN: [id number] Arrival date & time: 03/18/22  0941      History   Chief Complaint Chief Complaint  Patient presents with   Otalgia   Nasal Congestion   Rash   Dizziness    HPI Stacy Conner is a 52 y.o. female.   Patient presents to urgent care for evaluation of generalized body aches, rhinorrhea, and watery/itchy eyes that started yesterday. She states she and her family (3 children) moved apartments yesterday and she had to lift many heavy boxes. She attributes the heavy lifting to her body aches. States she has a history of allergic rhinitis and used to take medication but does not take medicine anymore since she ran out of it. Denies cough, fever/chills, headache, blurry vision, shortness of breath, chest pain, and heart palpitations. She does not have a history of chronic respiratory problems. She has not taken any over the counter medicines to help with symptoms. Children are sick with nasal congestion and cough as well.     Otalgia Associated symptoms: rash   Rash Dizziness   History reviewed. No pertinent past medical history.  Patient Active Problem List   Diagnosis Date Noted   Low back pain 07/22/2021    Past Surgical History:  Procedure Laterality Date   NO PAST SURGERIES      OB History     Gravida  3   Para  3   Term  3   Preterm      AB      Living  3      SAB      IAB      Ectopic      Multiple      Live Births               Home Medications    Prior to Admission medications   Medication Sig Start Date End Date Taking? Authorizing Provider  cetirizine (ZYRTEC) 10 MG tablet Take 1 tablet (10 mg total) by mouth daily. 03/18/22  Yes Talbot Grumbling, FNP  ibuprofen (ADVIL) 600 MG tablet Take 1 tablet (600 mg total) by mouth every 6 (six) hours as needed. 03/18/22  Yes Talbot Grumbling, FNP  olopatadine (PATANOL) 0.1 % ophthalmic solution Place 1 drop into both eyes 2 (two)  times daily. 03/18/22  Yes Talbot Grumbling, FNP  azithromycin (ZITHROMAX Z-PAK) 250 MG tablet Take 2 tablets initially, then 1 tablet daily until completed medication. 01/25/22   Nyoka Lint, PA-C  dextromethorphan-guaiFENesin [facility] DM) 30-600 MG 12hr tablet Take 1 tablet by mouth 2 (two) times daily. 01/25/22   Nyoka Lint, PA-C  fluticasone [facility]) 50 MCG/ACT nasal spray Place 2 sprays into both nostrils daily. 03/18/22   Talbot Grumbling, FNP  ONDANSETRON HCL PO Take by mouth.    [provider]  oseltamivir (TAMIFLU) 75 MG capsule Take 1 capsule (75 mg total) by mouth every 12 (twelve) hours. 01/25/22   Nyoka Lint, PA-C  Prenat-Fe Poly-Methfol-FA-DHA (VITAFOL ULTRA) 29-0.6-0.4-200 MG CAPS Take 1 tablet by mouth daily. 10/14/21   Constant, Peggy, MD    Family History Family History  Family history unknown: Yes    Social History Social History   Tobacco Use   Smoking status: Never   Smokeless tobacco: Never  Vaping Use   Vaping Use: Never used  Substance Use Topics   Alcohol use: Never   Drug use: Never     Allergies   Penicillins  Review of Systems Review of Systems  HENT:  Positive for ear pain.   Skin:  Positive for rash.  Neurological:  Positive for dizziness.  Per HPI   Physical Exam Triage Vital Signs ED Triage Vitals [03/18/22 1220]  Enc Vitals Group     BP 135/80     Pulse Rate 83     Resp 16     Temp 98.8 F (37.1 C)     Temp Source Oral     SpO2 97 %     Weight      Height      Head Circumference      Peak Flow      Pain Score 0     Pain Loc      Pain Edu?      Excl. in [place]?    No data found.  Updated Vital Signs BP 135/80 (BP Location: Right Arm)   Pulse 83   Temp 98.8 F (37.1 C) (Oral)   Resp 16   LMP 03/04/2022   SpO2 97%   Visual Acuity Right Eye Distance:   Left Eye Distance:   Bilateral Distance:    Right Eye Near:   Left Eye Near:    Bilateral Near:     Physical Exam Vitals and nursing note  reviewed.  Constitutional:      Appearance: She is not ill-appearing or toxic-appearing.  HENT:     Head: Normocephalic and atraumatic.     Right Ear: Hearing, tympanic membrane, ear canal and external ear normal.     Left Ear: Hearing, tympanic membrane, ear canal and external ear normal.     Nose: Rhinorrhea present.     Mouth/Throat:     Lips: Pink.     Mouth: Mucous membranes are moist.     Pharynx: No posterior oropharyngeal erythema.     Comments: Moderate amount of clear postnasal drainage visualized to the posterior oropharynx.  Eyes:     General: Lids are normal. Vision grossly intact. Gaze aligned appropriately.        Right eye: No discharge.        Left eye: No discharge.     Extraocular Movements: Extraocular movements intact.     Conjunctiva/sclera: Conjunctivae normal.     Pupils: Pupils are equal, round, and reactive to light.  Cardiovascular:     Rate and Rhythm: Normal rate and regular rhythm.     Heart sounds: Normal heart sounds, S1 normal and S2 normal.  Pulmonary:     Effort: Pulmonary effort is normal. No respiratory distress.     Breath sounds: Normal breath sounds and air entry.  Musculoskeletal:     Cervical back: Neck supple.     Right lower leg: No edema.     Left lower leg: No edema.  Lymphadenopathy:     Cervical: No cervical adenopathy.  Skin:    General: Skin is warm and dry.     Capillary Refill: Capillary refill takes less than 2 seconds.     Findings: No rash.  Neurological:     General: No focal deficit present.     Mental Status: She is alert and oriented to person, place, and time. Mental status is at baseline.     Cranial Nerves: No dysarthria or facial asymmetry.  Psychiatric:        Mood and Affect: Mood normal.        Speech: Speech normal.        Behavior: Behavior normal.  Thought Content: Thought content normal.        Judgment: Judgment normal.      UC Treatments / Results  Labs (all labs ordered are listed, but  only abnormal results are displayed) Labs Reviewed - No data to display  EKG   Radiology No results found.  Procedures Procedures (including critical care time)  Medications Ordered in UC Medications - No data to display  Initial Impression / Assessment and Plan / UC Course  I have reviewed the triage vital signs and the nursing notes.  Pertinent labs & imaging results that were available during my care of the patient were reviewed by me and considered in my medical decision making (see chart for details).   1. Allergic conjunctivitis of both eyes and allergic rhinitis Presentation is consistent with allergic rhinitis likely triggered by recent move yesterday and dust. Children are experiencing similar symptoms. No fever. Low suspicion for viral URI etiology, therefore testing deferred. Deferred imaging based on stable cardiopulmonary exam and stable vital signs. Medications sent to pharmacy to be used as needed for symptomatic relief.  She may use ibuprofen as needed for body aches related to recent move. Advised to rest and take epsom salt baths as well to relieve aches and pains. PCP follow-up recommended should symptoms fail to improve in the next 2-3 days with medications recommended.  Discussed physical exam and available lab work findings in clinic with patient.  Counseled patient regarding appropriate use of medications and potential side effects for all medications recommended or prescribed today. Discussed red flag signs and symptoms of worsening condition,when to call the PCP office, return to urgent care, and when to seek higher level of care in the emergency department. Patient verbalizes understanding and agreement with plan. All questions answered. Patient discharged in stable condition.    Final Clinical Impressions(s) / UC Diagnoses   Final diagnoses:  Allergic conjunctivitis of both eyes and rhinitis     Discharge Instructions      ?? ??????? ?? ???? ??????  ???? ???????? ??????? ?? ?????? ??????? ???????.  ????? ???????????? 600 ??? ?? 6 ????? ??? ?????? ????? ????? ??????.  ????? ???????? 10 ??? ?????? ?????? ?????? ????? ???????.  ?????? ???? ????? Flonase ?? ?? ???? ??? 1 ???? ?????? ???????? ?? ?????? ????? ???????.  ?????? ????? ????? ??????????? ?? ???? ??????? ?? 12 ???? ??? ?????? ?????? ????? ??????.  ??? ???? ???? ?? ????? ????? ?? ?????? ?? ?? ????? ???? ??????? ?? ??????? ???? ???????? ????? ??????. ??? ???? ?????? ?????? ???? ?????? ??? ???? ???????. ?? ????????? ?? ???? ??????? ??????? ????? ?? ?????? ???? ?? ??????? ?????? ??????? ???????? ??? ???????? ?????? ????????. ????? ?? ???? ?????! min almuhtamal 'an takun 'aeraduk bisabab alhasasiat alnaajimat ean alharakat al'akhirat walghubari. tanawal al'iibubrufin 600 milgh kuli 6 saeat hasab alhajat lalam aljism walamih. tanawal sitrizin 10 milgh ywmyan litajfif ahtiqan al'anf watasrifihi. aistakhdim radhadh al'anf Flonase fi kuli fathat 'anf 1 radhadh ywmyan lilmusaeadat fi aihtiqan al'anf watasrifihi. aistakhdim qatarat aleayn 'uwlubatadin fi kilta aleaynayn kula 12 saeat hasab alhajat litasrif aleayn walhakati. 'iidha zaharat ealayk 'ayu 'aerad jadidat 'aw tafaqamat 'aw lam tatahasan khilal alyawmayn 'aw althalathat 'ayaam alqadimati, fayurjaa aleawdatu. 'iidha kanat 'aeraduk shadidatun, yurjaa aldhahab 'iilaa ghurfat altawarii. qum bialmutabaeat mae muqadam alrieayat al'awaliat alkhasi bik li'iijra' mazid min altaqyim wa'iidarat al'aerad bial'iidafat 'iilaa alziyarat alsihiyat almustamirati. 'atamanaa 'an tasheur bitahasuni!  Your symptoms are likely due to allergies triggered by recent move and dust.  Take ibuprofen 600mg  every 6 hours as needed  for body aches and pain.   Take cetrizine 10mg  daily to dry up nasal congestion and drainage.  Use flonase nasal spray to each nostril 1 spray daily to help with nasal congestion and drainage.  Use olopatadine eye drops to both eyes  every 12 hours as needed for eye drainage and itching.  If you develop any new or worsening symptoms or do not improve in the next 2 to 3 days, please return.  If your symptoms are severe, please go to the emergency room.  Follow-up with your primary care provider for further evaluation and management of your symptoms as well as ongoing wellness visits.  I hope you feel better!     ED Prescriptions     Medication Sig Dispense Auth. Provider   fluticasone (FLONASE) 50 MCG/ACT nasal spray Place 2 sprays into both nostrils daily. 16 g M, FNP   cetirizine (ZYRTEC) 10 MG tablet Take 1 tablet (10 mg total) by mouth daily. 30 tablet M M, FNP   ibuprofen (ADVIL) 600 MG tablet Take 1 tablet (600 mg total) by mouth every 6 (six) hours as needed. 30 tablet M M, FNP   olopatadine (PATANOL) 0.1 % ophthalmic solution Place 1 drop into both eyes 2 (two) times daily. 5 mL M, FNP      PDMP not reviewed this encounter.   Carlisle Beers, Carlisle Beers 03/18/22 1314

## 2022-03-19 ENCOUNTER — Ambulatory Visit: Payer: Self-pay | Admitting: Nurse Practitioner

## 2022-03-26 NOTE — Congregational Nurse Program (Signed)
  Dept: [phone]   Congregational Nurse Program Note  Date of Encounter: 03/26/2022  Past Medical History: No past medical history on file.  Encounter Details:  CNP Questionnaire - 03/26/22 1215       Questionnaire   Ask client: Do you give verbal consent for me to treat you today? Yes    Student Assistance N/A    Location Patient Served  [facility]    Visit Setting with Client Organization    Patient Status Unknown    Insurance Uninsured ([place] [employer]s/Self-Pay/Medicaid Family Planning)    Insurance/Financial Assistance Referral Medicaid    Medication Have Medication Insecurities    Medical Provider Yes    Screening Referrals Made N/A    Medical Referrals Made N/A    Medical Appointment Made N/A    Recently w/o PCP, now 1st time PCP visit completed due to CNs referral or appointment made N/A    Food N/A    Transportation N/A    Housing/Utilities N/A    Interpersonal Safety N/A    Interventions Advocate/Support;Navigate Healthcare System;Case Management;Counsel;Educate;Spiritual Care    Abnormal to Normal Screening Since Last CN Visit N/A    Screenings CN Performed N/A    Sent Client to Lab for: N/A    Did client attend any of the following based off CNs referral or appointments made? N/A    ED Visit Averted Yes    Life-Saving Intervention Made N/A            Patient came in requesting appointment with PCP. Noted recent missed appointment. Patient has no shows in the past. Cautioned om appointment compliance. Appointment scheduled for 04/12/22 at [place] am  [facility] [phone]

## 2022-03-31 IMAGING — CR DG CHEST 2V
2 series · 2 of 2 positions shown · non-contrast
Comparison: None.

CLINICAL DATA: Abnormal chest x-ray.

EXAM:
CHEST - 2 VIEW

[w chest pa]
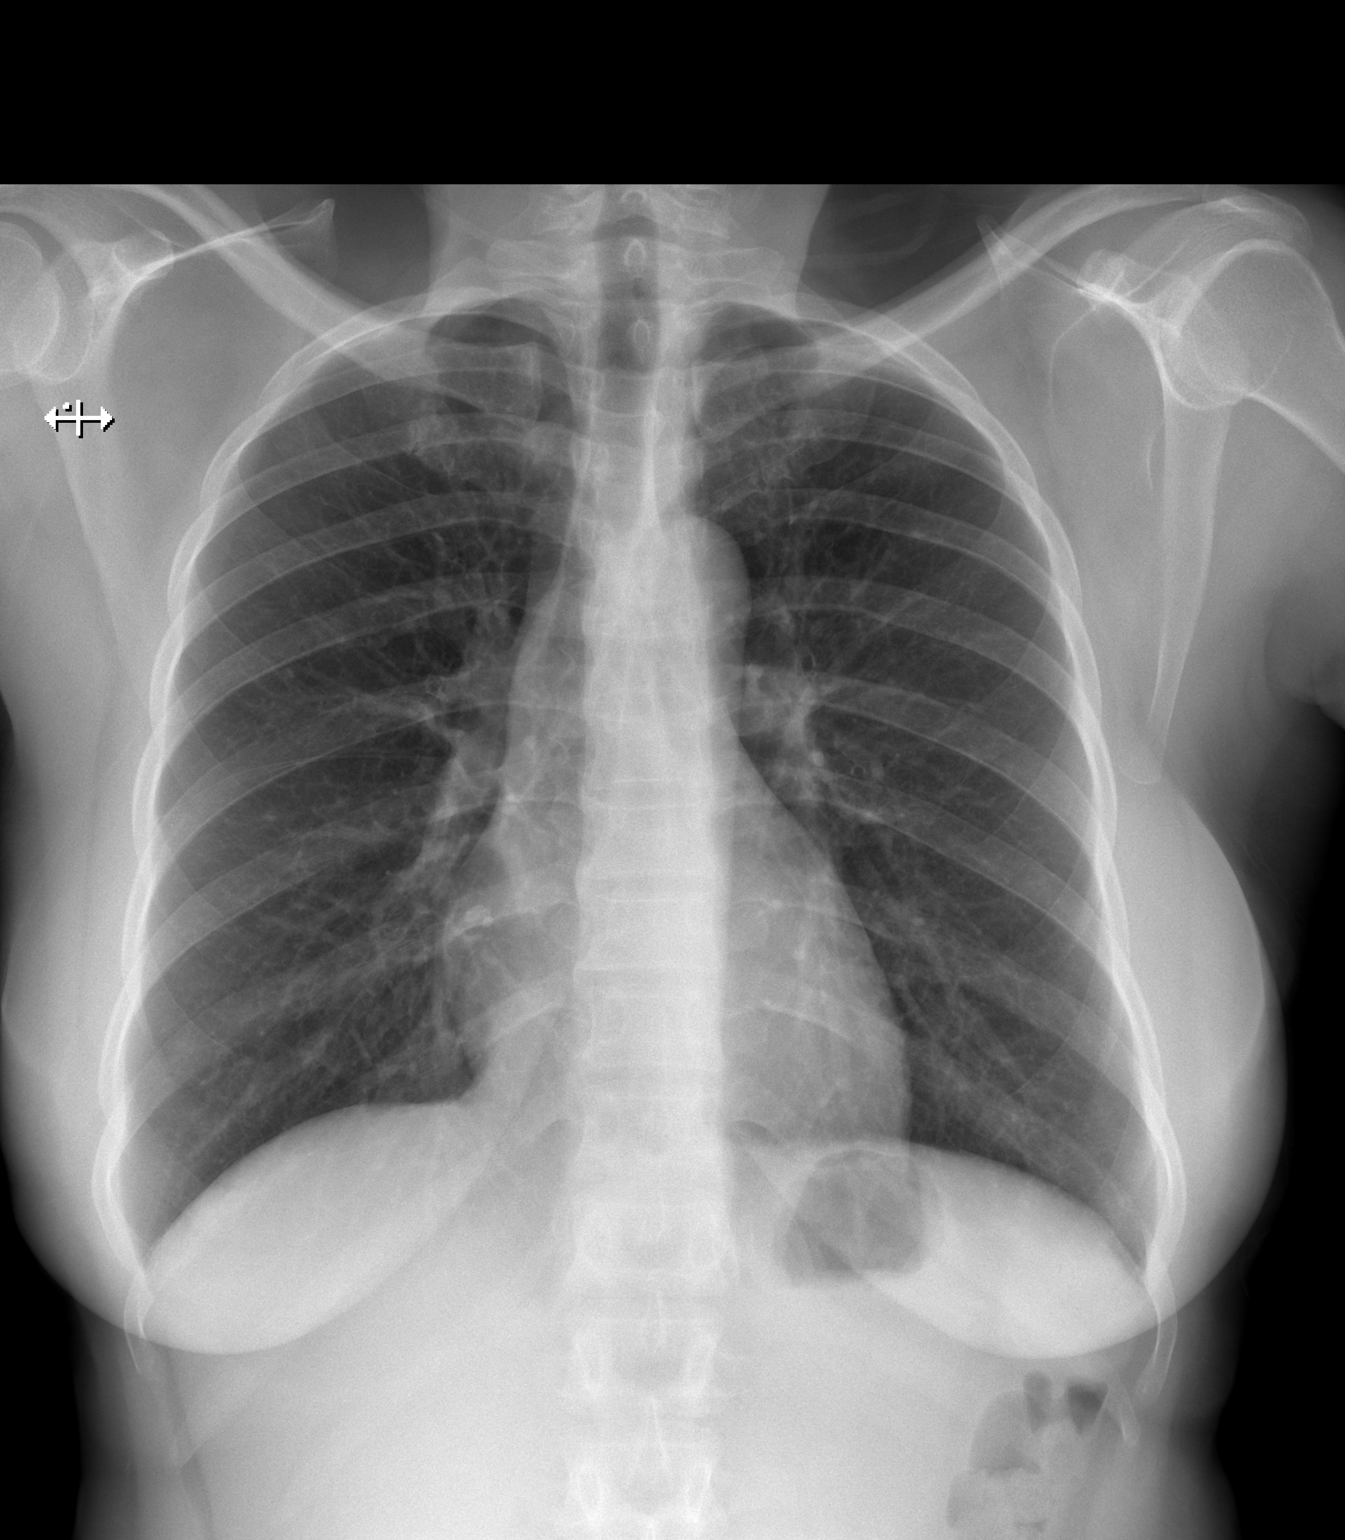

[w chest lat]
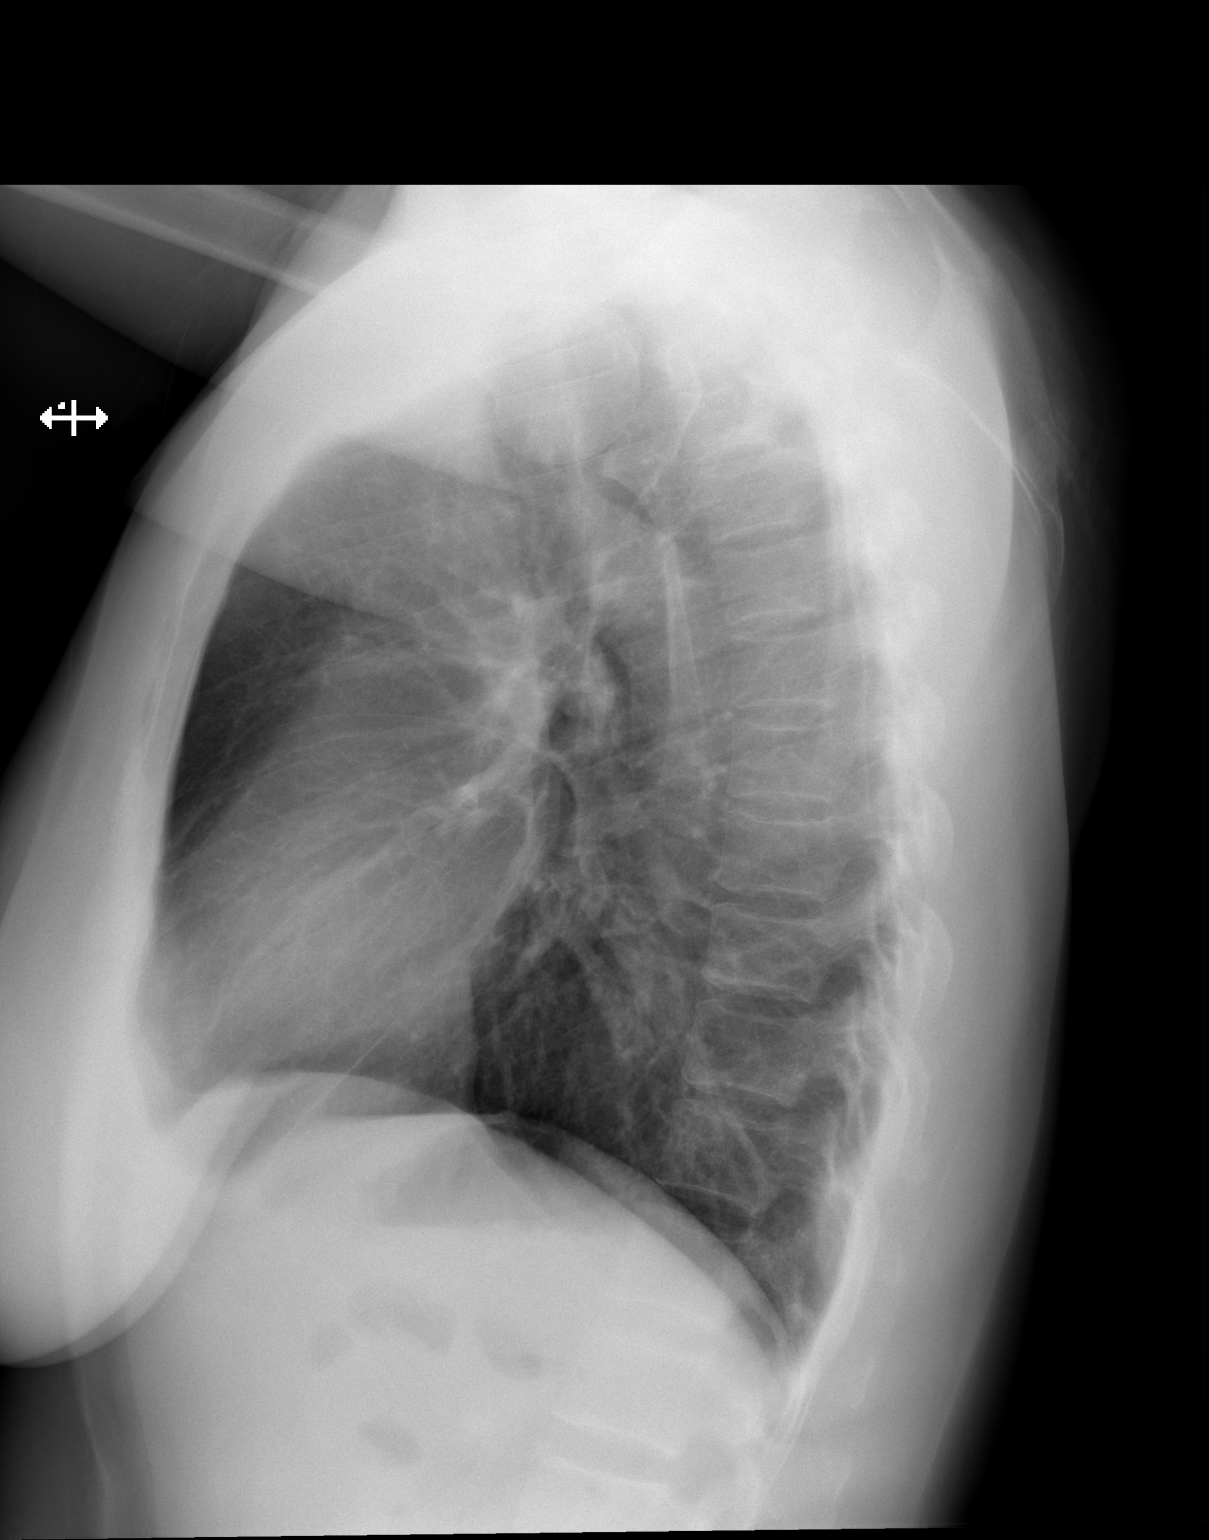

[2 of 2 positions shown; findings below may reference images not displayed]

FINDINGS: The heart size and mediastinal contours are within normal limits.
Both lungs are clear. Mild pulmonary hyperinflation is noted. The
visualized skeletal structures are unremarkable.
IMPRESSION: Mild pulmonary hyperinflation. No active cardiopulmonary disease.

## 2022-04-02 ENCOUNTER — Encounter: Payer: Self-pay | Admitting: Nurse Practitioner

## 2022-04-02 ENCOUNTER — Ambulatory Visit: Payer: Medicaid Other | Admitting: Nurse Practitioner

## 2022-04-02 VITALS — BP 125/76 | HR 89 | Temp 98.5°F | Ht 62.0 in | Wt 165.8 lb

## 2022-04-02 DIAGNOSIS — J310 Chronic rhinitis: Secondary | ICD-10-CM

## 2022-04-02 DIAGNOSIS — H1013 Acute atopic conjunctivitis, bilateral: Secondary | ICD-10-CM | POA: Diagnosis not present

## 2022-04-02 DIAGNOSIS — Z1211 Encounter for screening for malignant neoplasm of colon: Secondary | ICD-10-CM

## 2022-04-02 DIAGNOSIS — H1010 Acute atopic conjunctivitis, unspecified eye: Secondary | ICD-10-CM

## 2022-04-02 DIAGNOSIS — H6061 Unspecified chronic otitis externa, right ear: Secondary | ICD-10-CM | POA: Diagnosis not present

## 2022-04-02 DIAGNOSIS — M545 Low back pain, unspecified: Secondary | ICD-10-CM

## 2022-04-02 DIAGNOSIS — Z1231 Encounter for screening mammogram for malignant neoplasm of breast: Secondary | ICD-10-CM

## 2022-04-02 DIAGNOSIS — J309 Allergic rhinitis, unspecified: Secondary | ICD-10-CM

## 2022-04-02 DIAGNOSIS — Z0000 Encounter for general adult medical examination without abnormal findings: Secondary | ICD-10-CM

## 2022-04-02 HISTORY — DX: Acute atopic conjunctivitis, unspecified eye: H10.10

## 2022-04-02 HISTORY — DX: Unspecified chronic otitis externa, right ear: H60.61

## 2022-04-02 MED ORDER — DICLOFENAC SODIUM 1 % EX GEL: 4.0000 g | Freq: Four times a day (QID) | CUTANEOUS | 0 refills | Status: DC

## 2022-04-02 MED ORDER — CIPROFLOXACIN-HYDROCORTISONE 0.2-1 % OT SUSP: OTIC | 0 refills | Status: DC

## 2022-04-02 MED ORDER — KETOROLAC TROMETHAMINE 30 MG/ML IJ SOLN
30.0000 mg | Freq: Once | INTRAMUSCULAR | Status: AC
  Administered 2022-04-02: 30 mg via INTRAMUSCULAR

## 2022-04-02 NOTE — Patient Instructions (Addendum)
1. Acute right-sided low back pain, unspecified whether sciatica present  - diclofenac Sodium (VOLTAREN) 1 % GEL; Apply 4 g topically 4 (four) times daily.  Dispense: 50 g; Refill: 0  2. Encounter for screening mammogram for malignant neoplasm of breast  - MM Digital Screening; Future call [id number] to schedule your mammogram   3. Screening for colon cancer  - Cologuard  4. Chronic otitis externa of right ear, unspecified type  - ciprofloxacin-hydrocortisone (CIPRO HC OTIC) OTIC suspension; Instill 3 drops into right ear twice daily for 7 days  Dispense: 10 mL; Refill: 0 - Ambulatory referral to ENT  5. Health care maintenance ??????? ?????? ?????? Lumbosacral Strain ??????? ?????? ?????? ????? ???? ????? ?? ????? ???? ????? (?????? ?????? ?????? ??????). ???? ?? ???? ??? ??????? ????? ???? ????? ??????? ?? ??????? ???????? ??? ??? ?????? ??????. ???? ????? ???? ?????? ???? ?????? ??????. ????? ?? ???? ??????? ??? ???? ?? ???? ?? ???? ?? ????. ?? ????? ??? ??????? ???? ???? ??? ?????? ????? ??:  ???? ????? ????? ???? ??????.  ??????? ?? ????? ????? ???? ?????. ???? ?? ???? ?? ??????? ???????: ? ??????. ? ??? ??? ????. ? ?????? ?????? ?????? ??? ????? ?? ????????. ?? ????? ????? ?????? ???? ????? ??????? ??????? ?? ?????? ??????? ???? ??????:  ???????? ?? ????? ?????? ?? ????? ????? ???: ? ?????? ????? ?????. ? ????? ??? ?? ???.  ????? ????? ?? ??????.  ??? ????? ????????? ???? ????? ?? ??? ?????? ?? ??? ????? ????? ?? ?????.  ???? ?????? ???? ??? ????? ?? ????? ???? ?????.  ??? ????? ??? ??????. ?? ?????? ??? ?????? ?? ???????? ????? ??????? ???????? ???? ?????? ?? ??? ???? ????? ?? ???? ???????. ??? ?? ???? ?????? ???? ?? ??? ????? ?? ???????. ??? ????? ??? ??????? ????? ??? ?????? ???????? ??? ??????? ???? ????? ???? ??????? ????? ?????? ????? ??????. ??? ????? ????? ??????? ?? ???? ???? ??????? ?????? ??? ????? ?????? ?? ????? ?????? ???? ?????. ??? ???? ??? ????? ???  ?????? ?????? ???? ???????? ?????? ??? ????? ????? ??????? ???? ?????? ?????? ???. ??? ????? ?? ????? ???? ??????? ??? ?????? ??????? ???????? ??????? ?????????? (MRI). ??? ?????? ??? ??????? ???? ???? ??? ?????? ????????? ???????:  ????? ??????? ??????? ??????? ?? ???????.  ????? ????? ???????? ??? ????? ????? ?????? ???????.  ????? ??????? ??????? ?????????? ??? ??????????? (NSAIDs) ???????? ??? ????? ?????? ??????.  ?????? ?????? ??????? ???????? ????? ?????. ????? ?? ????? ??????? ???? ??? ?????? ?? ??????. ??? ??? ?????? ???? ??????? ?? ??? ????. ???? ???? ???????? ?????? ?????? ??????? ???????? ???????? ?? ???? ??? ???? ???? ?? ????? ????? ?????? ??????? ??? ??? ???????. ???? ????????? ??????? ?? ??????: ???????  ????? ???????? ???? ????? ????? ???? ?? ??? ???? ????? ??? ?? ????? ?? ???? ??????? ?????? ????? ??.  ??? ???? ??????? ?????? ??? ??? ??? ?????? ??????? ??: ? ?????? ??? ???? ??????? ?? ??????? ?????? ???????. ? ???? ?? ???? ???????. ????? ????? ??? ????? ??? ????????? ??????? ?? ??????? ?? ????? ?? ???? ?????: ? ???? ????? ????? ?? ??????? ???? ??? ???? ?????? ??????. ? ????? ????? ??? ???? ???? ?? ????? ????. ? ????? ??????? ?????? ???????? ??? ????????? ??????? ??????? ???????? ?????????? ???????. ? ??? ?? ????? ??????? ???? ????? ??? ??? ????? ?? ?????? ????????? ??????? ??? ??????? ??????? ?? ????????. ??????? ??? ????? ??????? ???????    ?? ??? ??? ??????? ??????? ??? ?? ?????? ????. ????? ??? ?????? ????????? ???????: ? ?? ????? ?? ??? ???????. ? ??? ????? ??? ???? ??????. ? ???? ????? ??????? ????  20 ?????? ?????? ??? ????? 2-3 ???? ??????.  ??? ??????? ??????? ??????? ??? ??? ?????? ???? ????? ?? ???? ??????? ??????? ??? ????? ??????? ????. ?????? ???? ??????? ???? ????? ?? ???? ??????? ?????? ??? ?????? ??????? ?? ????? ???????. ? ??? ????? ??? ????? ????? ???????. ? ???? ???? ??????? ??? ?????? ???? ?????? ?? 20-30 ?????. ? ??? ???? ??????? ??? ???? ??? ????? ???  ?????? ??????. ???? ???? ??? ?????? ??? ??? ????? ???? ?????? ?????? ?? ??????? ?? ???????. ????? ????? ????? ?????? ???????? ?? ??? ??????. ??????  ???? ??? ??? ??? ?? ?????? ??? ??????? ???? ??????? ?????? ??????? ??.  ?? ???? ?? ??????. ??????? ?? ?????? ????? ?? ??? ?? ????? ?? ???? ??????.  ???? ?????? ?????? ??????? ??? ??????? ??????? ??????? ??????. ?????? ???? ??????? ?????? ?? ??????? ?????? ??.  ???? ?????? ??????? ???? ????? ??? ?????? ?? ?????? ???? ?????? ???? ????? ??? ???? ??????? ??????.  ???? ??????? ??? ??????? ???? ??????? ??????. ????? ??? ?????? ?????? ?????? ???????. ??????? ????  ???? ???? ?????? ????? ?? ???? ??????. ????? ????? ?????? ??? ?????? ?? ?????? ????? ??? ??????.  ?? ?????? ?????? ????? ??? ????????? ?? ????? ??? ??????? ???????? ??????????? ???? ?????. ?????? ???? ??????? ?????? ??? ????? ??? ???????? ??????? ?? ???????.  ????? ????? ?????? ???????? ??? ??????? ???? ??????? ?????? ??????? ??. ???? ??? ???. ??? ????? ??????? ?? ????   ?????? ???????? ??????? ??? ?????? ??????? ???? ??????? ???????.  ????? ???????? ??????? ??? ?????? ???????.  ???? ??? ???? ?? ?????? ??????.  ?? ??? ????? ?????? ??????? ???? ????.  ???? ??????? ?????? ?????? ????? ???? 150 ????? ??? ????? ??? ???? ???????? ??? ????? ?????? ???????? ?????. ???? ?????? ??????? ?????? ???? ??? ????? ??????? ?? ????? ??? ??????? ????? ??????? ???????.  ???? ??? ?????? ???????? ??? ????: ? ?????. ? ???????. ???? ????? ??????? ?????? ?? ??????? ???????:  ??? ???? ??? ???? ??? ???? ??? ?????? ?? ??????.  ??? ?????? ??????? ??????? ???? ???????. ???? ???????? ??? ????? ?? ??????? ???????:  ????? ???? ???? ?? ????.  ??? ????? ??? ?????? ?? ?????.  ????? ?????? ?? ?????? ?? ??????.  ????? ???????? ?? ??????.  ????? ?????? ????? ?? ???? ?? ???? ?? ??????? ?? ???????.  ????? ?????? ?? ??? ?? ??? ?? ?????? ?? ??????? ???????? ?? ???????.  ?????? ???? ?? ??????? ???????: ? ???  ??????. ? ??????. ? ??? ?? ?????. ? ??? ?????? ?? ???????. ????  ??????? ?????? ?????? ????? ???? ????? ?? ????? ???? ????? (?????? ?????? ?????? ??????).  ???? ?? ???? ??? ??????? ????? ???? ????? ??????? ?? ??????? ???????? ??? ??? ?????? ??????.  ?? ???? ??? ?????? ????? ?????? ?????? ?? ???? ????? ?? ????? ????? ???? ????? ???? ?? ??????.  ????? ?? ????? ??????? ???? ??? ?????? ?? ??????. ??? ????? ?? ??? ????????? ?? ???? ?????? ????????? ???? ?????? ???? ??????? ??????. ???? ?? ?????? ??? ????? ???? ?? ???? ?? ???? ??????? ??????.? Document Revised: 08/19/2018 Document Reviewed: 08/19/2018 Elsevier Patient Education  [place] with Differential - CMP14+EGFR - Hemoglobin A1c

## 2022-04-02 NOTE — Assessment & Plan Note (Signed)
-   ciprofloxacin-hydrocortisone (CIPRO HC OTIC) OTIC suspension; Instill 3 drops into right ear twice daily for 7 days  Dispense: 10 mL; Refill: 0 - Ambulatory referral to ENT

## 2022-04-02 NOTE — Assessment & Plan Note (Deleted)
Patient encouraged to pick up prescriptions for Zyrtec 10 mg daily Flonase nasal spray 2 spray daily and take as ordered. Avoid allergens

## 2022-04-02 NOTE — Assessment & Plan Note (Addendum)
30 mg IM injection given in the office today She does not want to take ibuprofen Diclofenac 1% gel apply 4 g up to 4 times daily to affected site Application of heat, ice, stretching exercises encouraged

## 2022-04-02 NOTE — Progress Notes (Addendum)
Acute Office Visit  Subjective:     Patient ID: Stacy Conner, female    DOB: [birth year], 52 y.o.   MRN: [id number]  Chief Complaint  Patient presents with   Acute Visit    Right ear pain going for  about year but has gotten worse in last 3 day feels like full and pain in right eye.no headache , no fever , no sore throat , not taking any thing for pain     HPI Ms. Ralston with past medical history of low back pain, chronic otitis externa, allergic conjunctivitis of both eyes, rhinitis presents with complaints of chronic right ear pain that has gotten worse in the last 3 days .   Chronic otitis external right ear .chronic condition she has been referred to ENT in the past but she did not follow-up .  She denies ear discharge, loss of hearing  Acute low back pain. she also complains of right-sided low back pain with burning sensation radiating down her right leg ongoing for the past 3 days.  Rated her pain as 10/10.  Patient denies , trauma ,numbness, bladder or urinary incontinence.    Rhinitis allergic  conjunctivitis . she reports itchy eyes, sneezing, stuffiness, itchy nose.  She was evaluated at the emergency room about 2 weeks ago Flonase nasal spray cetirizine ordered but she did not pick up the medication.  Patient denies fever, chills, chest pain, cough, shortness of breath, wheezing   Due for colon cancer screening Cologuard test ordered referral sent to screening mammogram    Review of Systems  Constitutional:  Negative for chills, diaphoresis, fever and weight loss.  HENT:  Positive for congestion and ear pain. Negative for ear discharge, hearing loss, sinus pain and tinnitus.   Eyes:  Negative for photophobia, pain, discharge and redness.  Respiratory: Negative.  Negative for cough, hemoptysis, sputum production and shortness of breath.   Cardiovascular: Negative.  Negative for chest pain, palpitations, orthopnea and claudication.  Musculoskeletal:  Positive  for joint pain.  Neurological: Negative.  Negative for dizziness, tingling, tremors, sensory change and headaches.  Psychiatric/Behavioral: Negative.  Negative for depression, hallucinations, substance abuse and suicidal ideas. The patient is not nervous/anxious.         Objective:    BP 125/76   Pulse 89   Temp 98.5 F (36.9 C)   Ht '5\' 2"'$  (1.575 m)   Wt 165 lb 12.8 oz (75.2 kg)   LMP 03/04/2022   BMI 30.33 kg/m    Physical Exam Constitutional:      General: She is not in acute distress.    Appearance: Normal appearance. She is not ill-appearing, toxic-appearing or diaphoretic.  HENT:     Right Ear: Tympanic membrane and ear canal normal. There is no impacted cerumen.     Left Ear: Tympanic membrane, ear canal and external ear normal. There is no impacted cerumen.     Ears:     Comments: Voiced tenderness on examination right ear no drainage or redness noted tympanic membrane intact.  Eyes:     General: No scleral icterus.       Right eye: No discharge.        Left eye: No discharge.  Cardiovascular:     Rate and Rhythm: Normal rate and regular rhythm.     Pulses: Normal pulses.     Heart sounds: Normal heart sounds. No murmur heard.    No friction rub. No gallop.  Pulmonary:  Effort: Pulmonary effort is normal. No respiratory distress.     Breath sounds: Normal breath sounds. No stridor. No wheezing, rhonchi or rales.  Chest:     Chest wall: No tenderness.  Abdominal:     General: There is no distension.     Palpations: Abdomen is soft.     Tenderness: There is no abdominal tenderness. There is no right CVA tenderness, left CVA tenderness or guarding.  Musculoskeletal:        General: Tenderness present. No swelling, deformity or signs of injury. Normal range of motion.     Right lower leg: No edema.     Left lower leg: No edema.     Comments: Tenderness on palpation of right side low back, skin warm and dry has palpable pedal pulses no swelling or redness noted   Skin:    General: Skin is warm and dry.  Neurological:     Mental Status: She is alert and oriented to person, place, and time.     Sensory: No sensory deficit.     Motor: No weakness.     Coordination: Coordination normal.     Gait: Gait normal.  Psychiatric:        Mood and Affect: Mood normal.        Behavior: Behavior normal.        Thought Content: Thought content normal.        Judgment: Judgment normal.     No results found for any visits on 04/02/22.      Assessment & Plan:   Problem List Items Addressed This Visit       Respiratory   Allergic conjunctivitis and rhinitis    Patient encouraged to pick up prescriptions for Zyrtec 10 mg daily Flonase nasal spray 2 spray daily, olopatadine 0.1% ophthalmic solution 1 drop twice daily and take as ordered. Avoid allergens        Nervous and Auditory   Chronic otitis externa of right ear     - ciprofloxacin-hydrocortisone (CIPRO HC OTIC) OTIC suspension; Instill 3 drops into right ear twice daily for 7 days  Dispense: 10 mL; Refill: 0 - Ambulatory referral to ENT        Relevant Medications   ciprofloxacin-hydrocortisone (CIPRO HC OTIC) OTIC suspension   Other Relevant Orders   Ambulatory referral to ENT     Other   Low back pain - Primary    30 mg IM injection given in the office today She does not want to take ibuprofen Diclofenac 1% gel apply 4 g up to 4 times daily to affected site Application of heat, ice, stretching exercises encouraged      Relevant Medications   diclofenac Sodium (VOLTAREN) 1 % GEL   Health care maintenance   Relevant Orders   CBC with Differential   CMP14+EGFR   Hemoglobin A1c   Other Visit Diagnoses     Encounter for screening mammogram for malignant neoplasm of breast       Relevant Orders   MM 3D SCREEN BREAST BILATERAL   Screening for colon cancer       Relevant Orders   Cologuard       Meds ordered this encounter  Medications   ciprofloxacin-hydrocortisone  (CIPRO HC OTIC) OTIC suspension    Sig: Instill 3 drops into right ear twice daily for 7 days    Dispense:  10 mL    Refill:  0   diclofenac Sodium (VOLTAREN) 1 % GEL    Sig: Apply  4 g topically 4 (four) times daily.    Dispense:  50 g    Refill:  0   ketorolac (TORADOL) 30 MG/ML injection 30 mg    Return in about 4 weeks (around 04/30/2022) for CPE.  Renee Rival, FNP

## 2022-04-02 NOTE — Assessment & Plan Note (Signed)
Patient encouraged to pick up prescriptions for Zyrtec 10 mg daily Flonase nasal spray 2 spray daily, olopatadine 0.1% ophthalmic solution 1 drop twice daily and take as ordered. Avoid allergens

## 2022-04-19 ENCOUNTER — Encounter (HOSPITAL_COMMUNITY): Payer: Self-pay | Admitting: *Deleted

## 2022-04-19 ENCOUNTER — Emergency Department (HOSPITAL_COMMUNITY)
Admission: EM | Admit: 2022-04-19 | Discharge: 2022-04-19 | Disposition: A | Discharge status: Home / Self Care | Payer: Medicaid Other | Attending: Emergency Medicine | Admitting: Emergency Medicine

## 2022-04-19 ENCOUNTER — Emergency Department (HOSPITAL_COMMUNITY): Payer: Medicaid Other

## 2022-04-19 ENCOUNTER — Other Ambulatory Visit: Payer: Self-pay

## 2022-04-19 DIAGNOSIS — Z79899 Other long term (current) drug therapy: Secondary | ICD-10-CM | POA: Insufficient documentation

## 2022-04-19 DIAGNOSIS — R000 Tachycardia, unspecified: Secondary | ICD-10-CM | POA: Diagnosis not present

## 2022-04-19 DIAGNOSIS — R42 Dizziness and giddiness: Secondary | ICD-10-CM | POA: Insufficient documentation

## 2022-04-19 DIAGNOSIS — I10 Essential (primary) hypertension: Secondary | ICD-10-CM | POA: Insufficient documentation

## 2022-04-19 DIAGNOSIS — R509 Fever, unspecified: Secondary | ICD-10-CM | POA: Insufficient documentation

## 2022-04-19 DIAGNOSIS — H6122 Impacted cerumen, left ear: Secondary | ICD-10-CM | POA: Insufficient documentation

## 2022-04-19 DIAGNOSIS — R519 Headache, unspecified: Secondary | ICD-10-CM | POA: Diagnosis not present

## 2022-04-19 DIAGNOSIS — H9201 Otalgia, right ear: Secondary | ICD-10-CM | POA: Diagnosis present

## 2022-04-19 MED ORDER — MELOXICAM 7.5 MG PO TABS: 7.5000 mg | ORAL_TABLET | Freq: Every day | ORAL | 0 refills | Status: DC

## 2022-04-19 MED ORDER — KETOROLAC TROMETHAMINE 30 MG/ML IJ SOLN
30.0000 mg | Freq: Once | INTRAMUSCULAR | Status: AC
  Administered 2022-04-19: 30 mg via INTRAMUSCULAR
  Filled 2022-04-19: qty 1

## 2022-04-19 NOTE — ED Triage Notes (Signed)
Patient only speaks Arabic  family member at bedside that is speaking [place], c/o right ear pain onset yest after a fall, denies anyother pain.

## 2022-04-19 NOTE — ED Provider Notes (Signed)
[facility] Provider Note   CSN: [id number] Arrival date & time: 04/19/22  [id number]     History  Chief Complaint  Patient presents with   Otalgia    Stacy Conner is a 52 y.o. female.  52 y/o woman with PMHx of chronic otitis externa of her right ear who presents to the ED for c/o right ear pain.  Patient endorses that this is been a longstanding problem for her.  Has had intermittent aching right ear pain.  Reports that it worsened yesterday, she was unable to even touch her ear yesterday.  Also with associated headache, dizziness, room spinning sensation, blurred vision, hearing loss.  She reports she did fall yesterday, no LOC but did hit front of head. She was on the floor "for a while" and ultimately had to crawl to her bed and try to sleep for the remainder of the night although this was difficult since she was in significant pain.  She reports subjective fever yesterday, none today.  Denies any nausea or vomiting.  Has not been taking anything for the pain as she is fasting.  She reports that she completed the eardrops prescribed by her PCP without any improvement. She reports she has not yet heard from ENT.   Patient was recently seen by her PCP on 2/14 complaining of worsening right ear pain.  She has had long-standing right ear pain and diagnosed with chronic otitis externa. At last visit she was prescribed Cipro drops and given referral to ENT. Examination at that time was unremarkable except for tenderness on examination to right ear without abnormalities noted to TM.   Arabic interpreter [phone] used for entirety of encounter.       Home Medications Prior to Admission medications   Medication Sig Start Date End Date Taking? Authorizing Provider  cetirizine (ZYRTEC) 10 MG tablet Take 1 tablet (10 mg total) by mouth daily. Patient not taking: Reported on 04/02/2022 03/18/22   Talbot Grumbling, FNP   ciprofloxacin-hydrocortisone (CIPRO HC OTIC) OTIC suspension Instill 3 drops into right ear twice daily for 7 days 04/02/22   Renee Rival, FNP  diclofenac Sodium (VOLTAREN) 1 % GEL Apply 4 g topically 4 (four) times daily. 04/02/22   Paseda, Dewaine Conger, FNP  fluticasone (FLONASE) 50 MCG/ACT nasal spray Place 2 sprays into both nostrils daily. Patient not taking: Reported on 04/02/2022 03/18/22   Talbot Grumbling, FNP  ibuprofen (ADVIL) 600 MG tablet Take 1 tablet (600 mg total) by mouth every 6 (six) hours as needed. Patient not taking: Reported on 04/02/2022 03/18/22   Talbot Grumbling, FNP  meloxicam (MOBIC) 7.5 MG tablet Take 1 tablet (7.5 mg total) by mouth daily. 04/19/22   Sharion Settler, DO  olopatadine (PATANOL) 0.1 % ophthalmic solution Place 1 drop into both eyes 2 (two) times daily. Patient not taking: Reported on 04/02/2022 03/18/22   Talbot Grumbling, FNP  ONDANSETRON HCL PO Take by mouth. Patient not taking: Reported on 04/02/2022    [provider]  Prenat-Fe Poly-Methfol-FA-DHA (VITAFOL ULTRA) 29-0.6-0.4-200 MG CAPS Take 1 tablet by mouth daily. Patient not taking: Reported on 04/02/2022 10/14/21   Constant, Peggy, MD      Allergies    Penicillins    Review of Systems   Review of Systems  Constitutional:  Positive for fever.  HENT:  Positive for ear pain and hearing loss.        +Headache, blurred vision, no ear drainage  Neurological:  Positive for dizziness.    Physical Exam Updated Vital Signs BP 104/86   Pulse 86   Temp 98.1 F (36.7 C) (Oral)   Resp 16   Ht '5\' 6"'$  (1.676 m)   SpO2 100%   BMI 26.76 kg/m  Physical Exam Constitutional:      General: She is in acute distress.     Appearance: Normal appearance.  HENT:     Head: Normocephalic. Contusion present.      Comments: Frontal contusion    Right Ear: Tympanic membrane, ear canal and external ear normal.     Left Ear: Ear canal and external ear normal. There is impacted  cerumen.     Ears:     Comments: +ttp on mau    Mouth/Throat:     Pharynx: No oropharyngeal exudate or posterior oropharyngeal erythema.  Eyes:     Conjunctiva/sclera: Conjunctivae normal.  Cardiovascular:     Rate and Rhythm: Regular rhythm. Tachycardia present.  Pulmonary:     Effort: Pulmonary effort is normal.     Breath sounds: Normal breath sounds.  Musculoskeletal:     Cervical back: Neck supple. No rigidity or tenderness.  Lymphadenopathy:     Cervical: No cervical adenopathy.  Skin:    General: Skin is warm.  Neurological:     Mental Status: She is alert.     ED Results / Procedures / Treatments   Labs (all labs ordered are listed, but only abnormal results are displayed) Labs Reviewed - No data to display  EKG None  Radiology CT Temporal Bones Wo Contrast  Result Date: 04/19/2022 CLINICAL DATA:  Right ear pain beginning yesterday after a fall. EXAM: CT TEMPORAL BONES WITHOUT CONTRAST TECHNIQUE: Axial and coronal plane CT imaging of the petrous temporal bones was performed with thin-collimation image reconstruction. No intravenous contrast was administered. Multiplanar CT image reconstructions were also generated. RADIATION DOSE REDUCTION: This exam was performed according to the departmental dose-optimization program which includes automated exposure control, adjustment of the mA and/or kV according to patient size and/or use of iterative reconstruction technique. COMPARISON:  None Available. FINDINGS: RIGHT TEMPORAL BONE External auditory canal: Normal.  The tympanic membrane is normal. Middle ear cavity: Normally aerated. The scutum and ossicles are normal. The tegmen tympani is intact. Inner ear structures: The cochlea, vestibule and semicircular canals are normal. The vestibular aqueduct is not enlarged. Internal auditory and facial nerve canals:  Normal Mastoid air cells: There is a small right mastoid effusion without evidence of coalescence. No fracture is seen. The  tegmen is intact. LEFT TEMPORAL BONE External auditory canal: There is a small amount of debris, likely cerumen, in the external auditory canal. The tympanic membrane is normal. Middle ear cavity: Normally aerated. The scutum and ossicles are normal. The tegmen tympani is intact. Inner ear structures: The cochlea, vestibule and semicircular canals are normal. The vestibular aqueduct is not enlarged. Internal auditory and facial nerve canals:  Normal. Mastoid air cells: Normally aerated. No osseous erosion. Vascular: Normal non-contrast appearance of the carotid canals, jugular bulbs and sigmoid plates. Limited intracranial: The intracranial compartment is suboptimally evaluated due to technique. There is no definite acute abnormality in the imaged portions. Visible orbits/paranasal sinuses: The globes and orbits are grossly unremarkable. There is mild mucosal thickening in the imaged paranasal sinuses. Soft tissues: Unremarkable. IMPRESSION: 1. Small right mastoid effusion without evidence of coalescence, nonspecific. No fracture is seen. Correlate with history and physical exam. 2. Otherwise unremarkable appearance of the temporal bones.  Electronically Signed   By: Valetta Mole M.D.   On: 04/19/2022 14:22    Procedures Procedures    Medications Ordered in ED Medications  ketorolac (TORADOL) 30 MG/ML injection 30 mg (30 mg Intramuscular Given 04/19/22 1008)    ED Course/ Medical Decision Making/ A&P                             Medical Decision Making 52 y/o female with hx of chronic right otitis externa who presents to the ED with worsening R ear pain with associated hearing loss, dizziness, fall (2/2 pain without LOC), headache, subjective fever. Vitals notable for tachycardia- likely 2/2 pain, mildly hypertensive. On examination she is tearful in obvious discomfort, holding her right ear. No external abnormalities noted to external canal or internal R canal but does have ttp with manipulation of ear  and with palpation of mastoid. Differential includes mastoiditis, vestibular migraine, otitis externa (seems unlikely at this time given completion of cipro course without any relief), referred pain (dentition isn't great but no obvious cause on oral exam), acoustic neuroma, TMJ (seems unlikely). Will give dose of IM Toradol (would prefer no oral medications as she is fasting) and order CT temporal bones to evaluate for mastoiditis.   10:57 AM in to re-evaluate patient. She was laying back in bed, not holding ear. Arabic interpreter [phone] used. Patient reports pain is slightly improved after Toradol injection, now 9/10 instead of 10/10. She declines additional pain medication. She reports that pain medication does not typically help her. Discussed plans for CT scan. Will discuss findings with patient afterwards.  2:28 PM CT scan returned with small right mastoid effusion without coalescence- nonspecific. Would benefit from ENT f/u for further evaluation of this acute on chronic right ear pain. Will d/c with NSAID for analgesia.   2:47 PM updated patient with plan using interpreter [phone].  She continues to have headache, ear pain is slightly improved.  Discussed reassuring CT, recommend ENT follow-up. Will provide info on AVS. Confirmed pharmacy for analgesia, patient amenable to plan. Discussed return precautions including fever, worst headache of life, mastoid swelling.   Amount and/or Complexity of Data Reviewed External Data Reviewed: notes. Radiology: ordered.  Risk Prescription drug management.           Final Clinical Impression(s) / ED Diagnoses Final diagnoses:  Otalgia of right ear    Rx / DC Orders ED Discharge Orders          Ordered    meloxicam (MOBIC) 7.5 MG tablet  Daily,   Status:  Discontinued        04/19/22 1450    meloxicam (MOBIC) 7.5 MG tablet  Daily        04/19/22 1454              Sharion Settler, DO 04/19/22 1455    Carmin Muskrat,  MD 04/19/22 1459

## 2022-04-19 NOTE — Discharge Instructions (Addendum)
?? ???? ??????? ??????? ??????? ???? ????. ??? ?????? ??? ???? ??????? ???? ???? ??????? ??????? ????? ????? ?? ??????? ???? ????? ?????? ???????. ???? ??? ??? ????????.   ???? ???? ???? ????? ???? ?????????? ?? ????? ?? ????? ????????. ????? ???? ??????? ????? ??????? ?? ???????.   ?????? ???? ??? ????? ??????? ????? ?????? ???????? ???????- ?????????   [phone] ???? [address] ???? ??????? ???????? ????????? ? ???? ????????? [id number]  ??????? ???? ????? ?????? ???????? [phone] [address] , ????????? ???? ????????? [phone]   Your CT scan did not show infection. I am unsure what is causing your discomfort but it is recommended that you see the ear specialists for further evaluation of your ongoing right ear pain. Below are some options.   I am sending a prescription for an anti-inflammatory medication that may help ease the discomfort. You can also use a heating pad to the area.   [facility] [place] [phone] [address] [place] [address] [place] [address]  Dr. Benjamine Mola ENT [phone] [address] [place], [place] [address]

## 2022-04-19 NOTE — ED Notes (Signed)
Patient transported to CT 

## 2022-04-24 ENCOUNTER — Telehealth: Payer: Self-pay

## 2022-04-24 NOTE — Transitions of Care (Post Inpatient/ED Visit) (Cosign Needed)
   04/24/2022  Name: Stacy Conner MRN: [id number] DOB: [birth year]  Today's TOC FU Call Status: Today's TOC FU Call Status:: Successful TOC FU Call Competed TOC FU Call Complete Date: 04/24/22  Transition Care Management Follow-up Telephone Call Date of Discharge: 04/19/22 Discharge Facility: [facility]) Type of Discharge: Emergency Department Reason for ED Visit: Other: How have you been since you were released from the hospital?: Same Any questions or concerns?: No  Items Reviewed: Did you receive and understand the discharge instructions provided?: Yes Medications obtained and verified?: Yes (Medications Reviewed) Any new allergies since your discharge?: No Dietary orders reviewed?: NA Do you have support at home?: No  Home Care and Equipment/Supplies: [place] Ordered?: NA Any new equipment or medical supplies ordered?: NA  Functional Questionnaire: Do you need assistance with bathing/showering or dressing?: No Do you need assistance with meal preparation?: No Do you need assistance with eating?: No Do you have difficulty maintaining continence: No Do you need assistance with getting out of bed/getting out of a chair/moving?: No Do you have difficulty managing or taking your medications?: No  Folllow up appointments reviewed: PCP Follow-up appointment confirmed?: Yes Date of PCP follow-up appointment?: 04/28/22 Follow-up Provider: [facility] Follow-up appointment confirmed?: No Do you need transportation to your follow-up appointment?: No Do you understand care options if your condition(s) worsen?: Yes-patient verbalized understanding  SDOH Interventions Today    Flowsheet Row Most Recent Value  SDOH Interventions   Food Insecurity Interventions Other (Comment)  [advised pt of food bank to help until her food stamps]  Transportation Interventions Intervention Not Indicated       SIGNATURE. Elyse Jarvis RMA

## 2022-04-28 ENCOUNTER — Inpatient Hospital Stay: Payer: Self-pay | Admitting: Nurse Practitioner

## 2022-04-29 ENCOUNTER — Ambulatory Visit: Payer: Self-pay | Admitting: Nurse Practitioner

## 2022-05-30 ENCOUNTER — Inpatient Hospital Stay: Admission: RE | Admit: 2022-05-30 | Payer: Medicaid Other | Source: Ambulatory Visit

## 2022-06-05 ENCOUNTER — Ambulatory Visit
Admission: RE | Admit: 2022-06-05 | Discharge: 2022-06-05 | Disposition: A | Discharge status: Home / Self Care | Payer: Medicaid Other | Source: Ambulatory Visit | Attending: Nurse Practitioner | Admitting: Nurse Practitioner

## 2022-06-05 DIAGNOSIS — Z1231 Encounter for screening mammogram for malignant neoplasm of breast: Secondary | ICD-10-CM

## 2022-06-09 NOTE — Progress Notes (Signed)
Called Pt and inform results. Gh 

## 2022-06-15 ENCOUNTER — Encounter (HOSPITAL_COMMUNITY): Payer: Self-pay | Admitting: Emergency Medicine

## 2022-06-15 ENCOUNTER — Ambulatory Visit (HOSPITAL_COMMUNITY)
Admission: EM | Admit: 2022-06-15 | Discharge: 2022-06-15 | Disposition: A | Discharge status: Home / Self Care | Payer: Medicaid Other | Attending: Internal Medicine | Admitting: Internal Medicine

## 2022-06-15 DIAGNOSIS — H1013 Acute atopic conjunctivitis, bilateral: Secondary | ICD-10-CM

## 2022-06-15 DIAGNOSIS — K219 Gastro-esophageal reflux disease without esophagitis: Secondary | ICD-10-CM | POA: Diagnosis not present

## 2022-06-15 DIAGNOSIS — H6991 Unspecified Eustachian tube disorder, right ear: Secondary | ICD-10-CM | POA: Diagnosis not present

## 2022-06-15 DIAGNOSIS — R1013 Epigastric pain: Secondary | ICD-10-CM | POA: Diagnosis not present

## 2022-06-15 MED ORDER — OLOPATADINE HCL 0.1 % OP SOLN: 1.0000 [drp] | Freq: Two times a day (BID) | OPHTHALMIC | 0 refills | Status: DC

## 2022-06-15 MED ORDER — ONDANSETRON 4 MG PO TBDP
ORAL_TABLET | ORAL | Status: AC
  Filled 2022-06-15: qty 1

## 2022-06-15 MED ORDER — ALUM & MAG HYDROXIDE-SIMETH 200-200-20 MG/5ML PO SUSP
30.0000 mL | Freq: Once | ORAL | Status: AC
  Administered 2022-06-15: 30 mL via ORAL

## 2022-06-15 MED ORDER — ALUM & MAG HYDROXIDE-SIMETH 200-200-20 MG/5ML PO SUSP
ORAL | Status: AC
  Filled 2022-06-15: qty 30

## 2022-06-15 MED ORDER — LIDOCAINE VISCOUS HCL 2 % MT SOLN
15.0000 mL | Freq: Once | OROMUCOSAL | Status: AC
  Administered 2022-06-15: 15 mL via OROMUCOSAL

## 2022-06-15 MED ORDER — ACETAMINOPHEN 500 MG PO TABS: 500.0000 mg | ORAL_TABLET | Freq: Four times a day (QID) | ORAL | 0 refills | Status: DC | PRN

## 2022-06-15 MED ORDER — CETIRIZINE HCL 10 MG PO TABS: 10.0000 mg | ORAL_TABLET | Freq: Every day | ORAL | 2 refills | Status: DC

## 2022-06-15 MED ORDER — LIDOCAINE VISCOUS HCL 2 % MT SOLN
OROMUCOSAL | Status: AC
  Filled 2022-06-15: qty 15

## 2022-06-15 MED ORDER — ONDANSETRON 4 MG PO TBDP
4.0000 mg | ORAL_TABLET | Freq: Once | ORAL | Status: AC
  Administered 2022-06-15: 4 mg via ORAL

## 2022-06-15 MED ORDER — OMEPRAZOLE 20 MG PO CPDR: 20.0000 mg | DELAYED_RELEASE_CAPSULE | Freq: Every day | ORAL | 1 refills | Status: DC

## 2022-06-15 MED ORDER — FLUTICASONE PROPIONATE 50 MCG/ACT NA SUSP: 1.0000 | Freq: Every day | NASAL | 2 refills | Status: DC

## 2022-06-15 NOTE — ED Triage Notes (Signed)
Pt had bilateral ear pain for 3-4 days that worse worse last night causing not able to sleep. Pt tried taking a red children's allergy pill but didn't help. Pt adds that she hears a clicking noise that is worse in right ear.   Pt also c/o abd pains/cramping for 2 days and nausea that is worse after eating.   Also c/o allergy like symptoms.

## 2022-06-15 NOTE — Discharge Instructions (Addendum)
???? ??????? ???????? ??? ?????????. ?????? ?????? ??? ????? ???? ????? ???? ?????? ????? ???????? ????? ????? ???????? ???????? ?????? ?????. ??? ?????? ????? ??? ??????? ?? ????? ????? ?????? ???????? ?? ?????. ????? ???????? ??? ????? ?????? ??? 30 ????? ?? ????? ???? ???????. ?? ?????? ?? 8 ????? ??? ?????? ???????.  ???? ??????? ?????? ?? ??????? ??? ??????? ?? ?????????? ?? ?????? ?? ??????? ??????? ??? ???????? ????? ?? ???? ??? ???? ???????. ??? ??? ?????? ????? ???????? ?????? ?? ?????? ?????? ?? ????????. ???? ????? ?????? ??????? ??? ????? ??????? ??? ????????? ???? ???? ???????? ?? ??? ???????.  ?? ??????? ?? ???? ?????? ???? ???? ???? ???????? ???????. ???? ?????? ??????? ????????. ????? ?????? ?????????? ?? ???? ???? (??? Zyrtec? ?? Claritin? ?? Allegra) ??????? Flonase ?????? ??? ?????????. ????? ???? ??? ??????? ???? ???? ????. ???? ?? ?????? ??? ??????? ???? ???? ??? ???? ???? ??????? ????? ?? ?????. ?????? ??? ????? ????? ?? ?????.  ??? ????? ????? ?????? ??????? ????? ???? ????? ??????????? (???????) ???? ???? ???? ?????????? ??? ????????? ?????? ?????? ???????/????? ???????? ????????? ?????.  ??? ?? ????? ?????? ???? 5-7 ???? ??????? ?? ????????? ???? ??????. ???? ??? ???? ??????? ????? ??????? ??????? ??? ??? ??????? ?? ????? ???? ????? ?? ????? ?? ??? ??????? ?????? ?? ??????? ?? ?????? ??????? ?? ?????? ????????? ?? ???????? ?? ?????? ?? ????? ?????? ?? ??????? ?? ???? ?????? ????? ?? ?? ????? ?????/????? ????. ????? ?? ???? ?????! tanawal al'adwiat almusufat hasab altawjihati. sayusaeid aldawa' ealaa taqlil kamiyat alhamd alati tuntijuha maeadatuk wabialtaali tahsin 'aerad aliairtijae almurtabitat bi'iintaj alhimdu. laqad 'uetaytk dwa'an huna liusaeidak fi takhfif al'alam wataqlil alialtihab fi maedatika. tanawal brilwsyk maratan wahidatan ywmyan qabl 30 daqiqatan min tanawul wajbat al'iiftari. khudh zufran kula 8 saeat hasab alhajat lilghathayani. tajanub al'ateimat alharat  'aw alhamdiat mithl altamatim 'aw alshuwkulatat 'aw alqahwat 'aw alfawakih alhamdiat mithl alburtuqal li'anaha qad tuadiy 'iilaa zuhur al'aeradi. laqad qumt bitadmin taelim alairtijae alhimdii fi alhizmat alkhasat bik limurajieatika. yurjaa aydan alsamah bisaeatayn baed tanawul alwajabat qabl alaistilqa' bishakl musatah lilmusaeadat fi mane al'aeradi. min almuhtamal 'an takun 'aeraduk wa'alam 'udhunik bisabab alhasasiat albiyiyati. tajanub altaearud limusabibat alhasasiati. tanawal mudadaat alhistamin ean tariq alfam ('iimaa Zyrtec? 'aw Claritin? 'aw Allegra) wastukhdim Flonase ywmyan hasab altawjihatu. [place] shira' hadhih al'adwiat bidun wasfat tibiyatin. yumkin 'an tastaghriq hadhih al'adwiat bideat 'ayaam hataa tadkhul jismuk bialkamil watabda fi aleimali. aleawdat li'ayi 'aerad jadidat 'aw tafaqumin. 'iidha 'asbahat eaynak tusheiran bialhakati, [place] shira' qatarat 'uwlubatadin (bataday) bidun wasfat tibiyat wastikhdamiha hasab altawjihat litakhfif aleuyun almayiyati/alhukat almurtabitat bialhasasiat aydan. 'iidha lam tatahasan 'aeradak khilal 5-7 'ayaam alqadimat mae altadakhulati, yurjaa aleawdatu. NWGNFA 'iilaa ghurfat altawari lieilaj al'aerad alshadidat mithl diq altanafusi, 'aw tafaqum alam albatn 'aw alsadr 'aw eadam alsaytarat ealayha, 'aw alsadaei, 'aw aldawaar alkhafifi, 'aw alshueur bial'iighma'i, 'aw alghathayani, 'aw alqay', 'aw alqay' aldamawii 'aw albarazi, 'aw braz qutraniin 'aswadu, 'aw 'ayi 'aerad jadidatin/shadidat 'ukhraa. 'atamanaa 'an tasheur bitahasuni! Show less  Take prescribed medicines as directed. Medicine will help reduce the amount of acid your stomach makes and therefore improve your reflux symptoms related to acid production.  I gave you medicine here to help with your pain and reduce inflammation in your stomach. Take prilosec once daily 30 minutes before eating breakfast. Take zofran every 8 hours as needed for nausea.  Avoid spicy or acidic foods like tomatoes,  chocolate, coffee, or acidic fruits like oranges as these can trigger symptoms.  I have included acid reflux education in your packet for your review. Please also allow 2 hours after meals before lying flat to  help prevent symptoms.   Your symptoms and ear pain are likely due to environmental allergies.  Avoid exposure to allergens. Take oral antihistamine (Either Zyrtec, Claritin, or Allegra) and use Flonase daily as directed. You can buy these medications over the counter. These medications can take a few days to fully kick in to your body and start working. Return for any new or worsening symptoms.  If your eyes become itchy, you may purchase olopatadine (Pataday) eyedrops over the counter and use as directed to relieve watery/itchy eyes associated with allergies as well.   If your symptoms do not improve in the next 5-7 days with interventions, please return. Go to the emergency room for severe symptoms of shortness of breath, worsening or uncontrolled abdominal or chest pain, headache, light headedness, feeling faint, nausea, vomiting, bloody vomit or stools, black tarry stools, or any other new/severe symptoms. I hope you feel better!

## 2022-06-15 NOTE — ED Provider Notes (Signed)
[facility]    CSN: [id number] Arrival date & time: 06/15/22  1126      History   Chief Complaint Chief Complaint  Patient presents with   Otalgia   Abdominal Pain   Nausea    HPI Stacy Conner is a 52 y.o. female.   Patient presents to urgent care with multiple complaints. Reports increased sneezing, watery/itchy eyes, itchy throat, and rhinorrhea for the last few weeks. She also complains of "blockage in right ear" for the last 3 days that has become uncomfortable. States she has slightly decreased hearing from the right ear. Denies drainage from the right ear, dizziness, tinnitus, and recent head/ear trauma. Reports "popping" sound to the right ear. No jaw pain or history of TMJ. No rash. History of seasonal allergies. Denies cough, vomiting/diarrhea, fever/chills, chest pain, shortness of breath, and dizziness. Children have similar symptoms, no other known sick contacts. Non-smoker, denies drug use. Denies history of chronic respiratory problems.   Also complains of feeling full and bloated most times after eating with associated intermittent nausea without vomiting. Symptoms worsen when she lays flat after eating and at nighttime when she goes to sleep. Denies esophageal burning sensation, cough, and acid reflux. Reports upper middle abdominal discomfort and points to the epigastrium when asked to point to where stomach pain happens. States she drinks coffee frequently but denies intake of spicy/acidic foods. Denies heart palpitations, weakness, paresthesias, lower abdominal pain, and recent weight loss without trying. She does not drink alcohol.   The history is provided by the patient. A language interpreter was used.  Otalgia Associated symptoms: abdominal pain   Abdominal Pain   Past Medical History:  Diagnosis Date   Chronic low back pain with right-sided sciatica     Patient Active Problem List   Diagnosis Date Noted   Allergic rhinitis  06/20/2022   Abdominal pain 06/20/2022   Screening for endocrine, nutritional, metabolic and immunity disorder 06/20/2022   Chronic otitis externa of right ear 04/02/2022   Health care maintenance 04/02/2022   Allergic conjunctivitis and rhinitis 04/02/2022   Chronic low back pain with right-sided sciatica 07/22/2021    Past Surgical History:  Procedure Laterality Date   NO PAST SURGERIES      OB History     Gravida  3   Para  3   Term  3   Preterm      AB      Living  3      SAB      IAB      Ectopic      Multiple      Live Births               Home Medications    Prior to Admission medications   Medication Sig Start Date End Date Taking? Authorizing Provider  acetaminophen (TYLENOL) 500 MG tablet Take 1 tablet (500 mg total) by mouth every 6 (six) hours as needed. Patient not taking: Reported on 06/20/2022 06/15/22  Yes Carlisle Beers, FNP  cetirizine (ZYRTEC) 10 MG tablet Take 1 tablet (10 mg total) by mouth daily. Patient not taking: Reported on 06/20/2022 06/15/22  Yes Dae Antonucci, Donavan Burnet, FNP  fluticasone [facility]) 50 MCG/ACT nasal spray Place 1 spray into both nostrils daily. Patient not taking: Reported on 06/20/2022 06/15/22  Yes Christofer Shen, Donavan Burnet, FNP  olopatadine (PATANOL) 0.1 % ophthalmic solution Place 1 drop into both eyes 2 (two) times daily. Patient not taking: Reported on 06/20/2022 06/15/22  Yes Carlisle Beers, FNP  omeprazole (PRILOSEC) 20 MG capsule Take 1 capsule (20 mg total) by mouth daily. Patient not taking: Reported on 06/20/2022 06/15/22  Yes Carlisle Beers, FNP  diclofenac Sodium (VOLTAREN) 1 % GEL Apply 4 g topically 4 (four) times daily. 06/20/22   Paseda, Baird Kay, FNP  ONDANSETRON HCL PO Take by mouth. Patient not taking: Reported on 04/02/2022    [provider]  Prenat-Fe Poly-Methfol-FA-DHA (VITAFOL ULTRA) 29-0.6-0.4-200 MG CAPS Take 1 tablet by mouth daily. Patient not taking: Reported on  04/02/2022 10/14/21   Constant, Peggy, MD    Family History Family History  Family history unknown: Yes    Social History Social History   Tobacco Use   Smoking status: Never   Smokeless tobacco: Never  Vaping Use   Vaping Use: Never used  Substance Use Topics   Alcohol use: Never   Drug use: Never     Allergies   Penicillins   Review of Systems Review of Systems  HENT:  Positive for ear pain.   Gastrointestinal:  Positive for abdominal pain.  Per HPI   Physical Exam Triage Vital Signs ED Triage Vitals [06/15/22 1219]  Enc Vitals Group     BP 126/85     Pulse Rate 88     Resp 17     Temp 98.7 F (37.1 C)     Temp Source Oral     SpO2 98 %     Weight      Height      Head Circumference      Peak Flow      Pain Score 10     Pain Loc      Pain Edu?      Excl. in GC?    No data found.  Updated Vital Signs BP 126/85 (BP Location: Left Arm)   Pulse 88   Temp 98.7 F (37.1 C) (Oral)   Resp 17   LMP 06/01/2022   SpO2 98%   Visual Acuity Right Eye Distance:   Left Eye Distance:   Bilateral Distance:    Right Eye Near:   Left Eye Near:    Bilateral Near:     Physical Exam Vitals and nursing note reviewed.  Constitutional:      Appearance: She is not ill-appearing or toxic-appearing.  HENT:     Head: Normocephalic and atraumatic.     Right Ear: Hearing, ear canal and external ear normal. A middle ear effusion is present.     Left Ear: Hearing, ear canal and external ear normal. A middle ear effusion is present.     Nose: Rhinorrhea present.     Mouth/Throat:     Lips: Pink.     Mouth: Mucous membranes are moist. No injury.     Tongue: No lesions. Tongue does not deviate from midline.     Palate: No mass and lesions.     Pharynx: Oropharynx is clear. Uvula midline. No pharyngeal swelling, oropharyngeal exudate, posterior oropharyngeal erythema or uvula swelling.     Tonsils: No tonsillar exudate or tonsillar abscesses.  Eyes:     General:  Lids are normal. Vision grossly intact. Gaze aligned appropriately.        Right eye: No discharge.        Left eye: No discharge.     Extraocular Movements: Extraocular movements intact.     Conjunctiva/sclera: Conjunctivae normal.  Cardiovascular:     Rate and Rhythm: Normal rate and regular rhythm.  Heart sounds: Normal heart sounds, S1 normal and S2 normal.  Pulmonary:     Effort: Pulmonary effort is normal. No respiratory distress.     Breath sounds: Normal breath sounds and air entry.  Abdominal:     General: Bowel sounds are normal.     Palpations: Abdomen is soft.     Tenderness: There is abdominal tenderness in the epigastric area. There is no right CVA tenderness, left CVA tenderness or guarding.  Musculoskeletal:     Cervical back: Normal range of motion and neck supple.     Right lower leg: No edema.     Left lower leg: No edema.  Lymphadenopathy:     Cervical: No cervical adenopathy.  Skin:    General: Skin is warm and dry.     Capillary Refill: Capillary refill takes less than 2 seconds.     Findings: No rash.  Neurological:     General: No focal deficit present.     Mental Status: She is alert and oriented to person, place, and time. Mental status is at baseline.     Cranial Nerves: No dysarthria or facial asymmetry.  Psychiatric:        Mood and Affect: Mood normal.        Speech: Speech normal.        Behavior: Behavior normal.        Thought Content: Thought content normal.        Judgment: Judgment normal.      UC Treatments / Results  Labs (all labs ordered are listed, but only abnormal results are displayed) Labs Reviewed - No data to display  EKG   Radiology No results found.  Procedures Procedures (including critical care time)  Medications Ordered in UC Medications  ondansetron (ZOFRAN-ODT) disintegrating tablet 4 mg (4 mg Oral Given 06/15/22 1310)  alum & mag hydroxide-simeth (MAALOX/MYLANTA) 200-200-20 MG/5ML suspension 30 mL (30 mLs  Oral Given 06/15/22 1311)  lidocaine (XYLOCAINE) 2 % viscous mouth solution 15 mL (15 mLs Mouth/Throat Given 06/15/22 1310)    Initial Impression / Assessment and Plan / UC Course  I have reviewed the triage vital signs and the nursing notes.  Pertinent labs & imaging results that were available during my care of the patient were reviewed by me and considered in my medical decision making (see chart for details).   1. Abdominal pain, epigastric; GERD Presentation consistent with GERD. Patient given GI cocktail in clinic with some improvement of symptoms. Advised to avoid NSAIDs, spicy/citrus based foods, caffeine, etc. Advised to avoid laying flat for at least 2 hours after eating and recommend smaller/more frequent meals to reduce burning sensation to the epigastrium. Prilosec once daily sent to pharmacy to be taken as prescribed. PCP follow-up recommended.  2. Allergic rhinitis and conjunctivitis, dysfunction of right eustachian tube Symptoms and physical exam consistent with allergic rhinitis etiology. Doubt acute viral URI cause of symptoms. No indication for imaging at this time based on stable cardiopulmonary exam and hemodynamically stable vital signs. Advised to avoid known allergens and begin using daily antihistamine cetirizine 10mg  to dry up post-nasal drainage that is likely causing cough. Flonase daily for nasal inflammation and rhinorrhea. Olopatadine eye drops for symptomatic relief related to suspected allergic conjunctivitis/watery itchy eyes. Patient agreeable with plan.  Discussed physical exam and available lab work findings in clinic with patient.  Counseled patient regarding appropriate use of medications and potential side effects for all medications recommended or prescribed today. Discussed red flag signs and symptoms  of worsening condition,when to call the PCP office, return to urgent care, and when to seek higher level of care in the emergency department. Patient verbalizes  understanding and agreement with plan. All questions answered. Patient discharged in stable condition.    Final Clinical Impressions(s) / UC Diagnoses   Final diagnoses:  Abdominal pain, epigastric  Dysfunction of right eustachian tube  Gastroesophageal reflux disease without esophagitis     Discharge Instructions      ???? ??????? ???????? ??? ?????????. ?????? ?????? ??? ????? ???? ????? ???? ?????? ????? ???????? ????? ????? ???????? ???????? ?????? ?????. ??? ?????? ????? ??? ??????? ?? ????? ????? ?????? ???????? ?? ?????. ????? ???????? ??? ????? ?????? ??? 30 ????? ?? ????? ???? ???????. ?? ?????? ?? 8 ????? ??? ?????? ???????.  ???? ??????? ?????? ?? ??????? ??? ??????? ?? ?????????? ?? ?????? ?? ??????? ??????? ??? ???????? ????? ?? ???? ??? ???? ???????. ??? ??? ?????? ????? ???????? ?????? ?? ?????? ?????? ?? ????????. ???? ????? ?????? ??????? ??? ????? ??????? ??? ????????? ???? ???? ???????? ?? ??? ???????.  ?? ??????? ?? ???? ?????? ???? ???? ???? ???????? ???????. ???? ?????? ??????? ????????. ????? ?????? ?????????? ?? ???? ???? (??? Zyrtec? ?? Claritin? ?? Allegra) ??????? Flonase ?????? ??? ?????????. ????? ???? ??? ??????? ???? ???? ????. ???? ?? ?????? ??? ??????? ???? ???? ??? ???? ???? ??????? ????? ?? ?????. ?????? ??? ????? ????? ?? ?????.  ??? ????? ????? ?????? ??????? ????? ???? ????? ??????????? (???????) ???? ???? ???? ?????????? ??? ????????? ?????? ?????? ???????/????? ???????? ????????? ?????.  ??? ?? ????? ?????? ???? 5-7 ???? ??????? ?? ????????? ???? ??????. ???? ??? ???? ??????? ????? ??????? ??????? ??? ??? ??????? ?? ????? ???? ????? ?? ????? ?? ??? ??????? ?????? ?? ??????? ?? ?????? ??????? ?? ?????? ????????? ?? ???????? ?? ?????? ?? ????? ?????? ?? ??????? ?? ???? ?????? ????? ?? ?? ????? ?????/????? ????. ????? ?? ???? ?????! tanawal al'adwiat almusufat hasab altawjihati. sayusaeid aldawa' ealaa taqlil kamiyat alhamd alati tuntijuha maeadatuk  wabialtaali tahsin 'aerad aliairtijae almurtabitat bi'iintaj alhimdu. laqad 'uetaytk dwa'an huna liusaeidak fi takhfif al'alam wataqlil alialtihab fi maedatika. tanawal brilwsyk maratan wahidatan ywmyan qabl 30 daqiqatan min tanawul wajbat al'iiftari. khudh zufran kula 8 saeat hasab alhajat lilghathayani. tajanub al'ateimat alharat 'aw alhamdiat mithl altamatim 'aw alshuwkulatat 'aw alqahwat 'aw alfawakih alhamdiat mithl alburtuqal li'anaha qad tuadiy 'iilaa zuhur al'aeradi. laqad qumt bitadmin taelim alairtijae alhimdii fi alhizmat alkhasat bik limurajieatika. yurjaa aydan alsamah bisaeatayn baed tanawul alwajabat qabl alaistilqa' bishakl musatah lilmusaeadat fi mane al'aeradi. min almuhtamal 'an takun 'aeraduk wa'alam 'udhunik bisabab alhasasiat albiyiyati. tajanub altaearud limusabibat alhasasiati. tanawal mudadaat alhistamin ean tariq alfam ('iimaa Zyrtec? 'aw Claritin? 'aw Allegra) wastukhdim Flonase ywmyan hasab altawjihatu. [place] shira' hadhih al'adwiat bidun wasfat tibiyatin. yumkin 'an tastaghriq hadhih al'adwiat bideat 'ayaam hataa tadkhul jismuk bialkamil watabda fi aleimali. aleawdat li'ayi 'aerad jadidat 'aw tafaqumin. 'iidha 'asbahat eaynak tusheiran bialhakati, [place] shira' qatarat 'uwlubatadin (bataday) bidun wasfat tibiyat wastikhdamiha hasab altawjihat litakhfif aleuyun almayiyati/alhukat almurtabitat bialhasasiat aydan. 'iidha lam tatahasan 'aeradak khilal 5-7 'ayaam alqadimat mae altadakhulati, yurjaa aleawdatu. ZOXWRU 'iilaa ghurfat altawari lieilaj al'aerad alshadidat mithl diq altanafusi, 'aw tafaqum alam albatn 'aw alsadr 'aw eadam alsaytarat ealayha, 'aw alsadaei, 'aw aldawaar alkhafifi, 'aw alshueur bial'iighma'i, 'aw alghathayani, 'aw alqay', 'aw alqay' aldamawii 'aw albarazi, 'aw braz qutraniin 'aswadu, 'aw 'ayi 'aerad jadidatin/shadidat 'ukhraa. 'atamanaa 'an tasheur bitahasuni! Show less  Take prescribed medicines as directed. Medicine will help reduce the amount of acid  your stomach makes and therefore improve your reflux symptoms related to acid  production.  I gave you medicine here to help with your pain and reduce inflammation in your stomach. Take prilosec once daily 30 minutes before eating breakfast. Take zofran every 8 hours as needed for nausea.  Avoid spicy or acidic foods like tomatoes, chocolate, coffee, or acidic fruits like oranges as these can trigger symptoms.  I have included acid reflux education in your packet for your review. Please also allow 2 hours after meals before lying flat to help prevent symptoms.   Your symptoms and ear pain are likely due to environmental allergies.  Avoid exposure to allergens. Take oral antihistamine (Either Zyrtec, Claritin, or Allegra) and use Flonase daily as directed. You can buy these medications over the counter. These medications can take a few days to fully kick in to your body and start working. Return for any new or worsening symptoms.  If your eyes become itchy, you may purchase olopatadine (Pataday) eyedrops over the counter and use as directed to relieve watery/itchy eyes associated with allergies as well.   If your symptoms do not improve in the next 5-7 days with interventions, please return. Go to the emergency room for severe symptoms of shortness of breath, worsening or uncontrolled abdominal or chest pain, headache, light headedness, feeling faint, nausea, vomiting, bloody vomit or stools, black tarry stools, or any other new/severe symptoms. I hope you feel better!      ED Prescriptions     Medication Sig Dispense Auth. Provider   omeprazole (PRILOSEC) 20 MG capsule Take 1 capsule (20 mg total) by mouth daily. Patient not taking:  Reported on 06/20/2022 30 capsule Reita May M, FNP   fluticasone [facility]) 50 MCG/ACT nasal spray Place 1 spray into both nostrils daily. Patient not taking:  Reported on 06/20/2022 16 g Carlisle Beers, FNP   cetirizine (ZYRTEC) 10 MG tablet Take 1  tablet (10 mg total) by mouth daily. Patient not taking:  Reported on 06/20/2022 30 tablet Carlisle Beers, FNP   acetaminophen (TYLENOL) 500 MG tablet Take 1 tablet (500 mg total) by mouth every 6 (six) hours as needed. Patient not taking:  Reported on 06/20/2022 30 tablet Carlisle Beers, FNP   olopatadine (PATANOL) 0.1 % ophthalmic solution Place 1 drop into both eyes 2 (two) times daily. Patient not taking:  Reported on 06/20/2022 5 mL Carlisle Beers, FNP      PDMP not reviewed this encounter.   Carlisle Beers, [place] 06/22/22 2050

## 2022-06-20 ENCOUNTER — Ambulatory Visit: Payer: Medicaid Other | Admitting: Nurse Practitioner

## 2022-06-20 ENCOUNTER — Encounter: Payer: Self-pay | Admitting: Nurse Practitioner

## 2022-06-20 VITALS — BP 116/75 | HR 83 | Temp 97.3°F | Ht 66.0 in | Wt 164.2 lb

## 2022-06-20 DIAGNOSIS — R1013 Epigastric pain: Secondary | ICD-10-CM | POA: Diagnosis not present

## 2022-06-20 DIAGNOSIS — J309 Allergic rhinitis, unspecified: Secondary | ICD-10-CM | POA: Diagnosis not present

## 2022-06-20 DIAGNOSIS — H6061 Unspecified chronic otitis externa, right ear: Secondary | ICD-10-CM

## 2022-06-20 DIAGNOSIS — R109 Unspecified abdominal pain: Secondary | ICD-10-CM | POA: Insufficient documentation

## 2022-06-20 DIAGNOSIS — M5441 Lumbago with sciatica, right side: Secondary | ICD-10-CM

## 2022-06-20 DIAGNOSIS — Z1329 Encounter for screening for other suspected endocrine disorder: Secondary | ICD-10-CM

## 2022-06-20 DIAGNOSIS — K219 Gastro-esophageal reflux disease without esophagitis: Secondary | ICD-10-CM | POA: Insufficient documentation

## 2022-06-20 DIAGNOSIS — Z130 Encounter for screening for diseases of the blood and blood-forming organs and certain disorders involving the immune mechanism: Secondary | ICD-10-CM | POA: Insufficient documentation

## 2022-06-20 DIAGNOSIS — Z13228 Encounter for screening for other metabolic disorders: Secondary | ICD-10-CM

## 2022-06-20 DIAGNOSIS — Z1321 Encounter for screening for nutritional disorder: Secondary | ICD-10-CM

## 2022-06-20 DIAGNOSIS — G8929 Other chronic pain: Secondary | ICD-10-CM

## 2022-06-20 MED ORDER — DICLOFENAC SODIUM 1 % EX GEL
4.0000 g | Freq: Four times a day (QID) | CUTANEOUS | 3 refills | Status: DC
Start: 2022-06-20 — End: 2022-11-10

## 2022-06-20 NOTE — Patient Instructions (Signed)
1. Chronic low back pain, unspecified back pain laterality, unspecified whether sciatica present  - Ambulatory referral to Orthopedic Surgery - diclofenac Sodium (VOLTAREN) 1 % GEL; Apply 4 g topically 4 (four) times daily.  Dispense: 50 g; Refill: 3  2. Allergic rhinitis, unspecified seasonality, unspecified trigger   3. Epigastric pain    It is important that you exercise regularly at least 30 minutes 5 times a week as tolerated  Think about what you will eat, plan ahead. Choose " clean, green, fresh or frozen" over canned, processed or packaged foods which are more sugary, salty and fatty. 70 to 75% of food eaten should be vegetables and fruit. Three meals at set times with snacks allowed between meals, but they must be fruit or vegetables. Aim to eat over a 12 hour period , example 7 am to 7 pm, and STOP after  your last meal of the day. Drink water,generally about 64 ounces per day, no other drink is as healthy. Fruit juice is best enjoyed in a healthy way, by EATING the fruit.  Thanks for choosing [facility] we consider it a privelige to serve you.

## 2022-06-20 NOTE — Progress Notes (Signed)
Established Patient Office Visit  Subjective:  Patient ID: Stacy Conner, female    DOB: 01-12-1971  Age: 52 y.o. MRN: [id number]  CC:  Chief Complaint  Patient presents with   Annual Exam    HPI Stacy Conner is a 52 y.o. female past medical history of otitis externa right ear, chronic low back pain, allergic rhinitis who presents for follow-up for her CPE but she would like to focus on her chronic medical conditions today.   She continues to have back pain radiating to her right leg and foot.  Voltaren gel helps her pain but she has been out of Voltaren gel, physical therapy also helped in the past.  She does not want ibuprofen concerned about effect on her kidneys.  Currently has pain rated 10/10.  She denies urinary or bowel incontinence.  She also continues to have right ear pain with black-colored discharge sometimes and  impaired hearing.  She denies fever, chills, malaise.  She was referred to ENT but she has not been seen yet.  Cologuard test ordered at her last visit stated that she does not know how to complete the test.  Return instructions in Arabic provided today  She was seen at emergency rooms about a week ago for complaints of epigastric abdominal pain.  States that her pain is much improved after she was given Maalox/Mylanta.  Pantoprazole ordered but she has not started using the medication.  She denies nausea, vomiting, bloody stool  Interpretation services provided via [facility] health iPad.      Past Medical History:  Diagnosis Date   Chronic low back pain with right-sided sciatica     Past Surgical History:  Procedure Laterality Date   NO PAST SURGERIES      Family History  Family history unknown: Yes    Social History   Socioeconomic History   Marital status: Married    Spouse name: Not on file   Number of children: 3   Years of education: Not on file   Highest education level: Not on file  Occupational History   Not on  file  Tobacco Use   Smoking status: Never   Smokeless tobacco: Never  Vaping Use   Vaping Use: Never used  Substance and Sexual Activity   Alcohol use: Never   Drug use: Never   Sexual activity: Yes    Birth control/protection: None  Other Topics Concern   Not on file  Social History Narrative   Not on file   Social Determinants of Health   Financial Resource Strain: Not on file  Food Insecurity: Food Insecurity Present (04/24/2022)   Hunger Vital Sign    Worried About Running Out of Food in the Last Year: Sometimes true    Ran Out of Food in the Last Year: Sometimes true  Transportation Needs: No Transportation Needs (04/24/2022)   PRAPARE - Administrator, Civil Service (Medical): No    Lack of Transportation (Non-Medical): No  Physical Activity: Not on file  Stress: Not on file  Social Connections: Not on file  Intimate Partner Violence: Not on file    Outpatient Medications Prior to Visit  Medication Sig Dispense Refill   acetaminophen (TYLENOL) 500 MG tablet Take 1 tablet (500 mg total) by mouth every 6 (six) hours as needed. (Patient not taking: Reported on 06/20/2022) 30 tablet 0   cetirizine (ZYRTEC) 10 MG tablet Take 1 tablet (10 mg total) by mouth daily. (Patient not taking: Reported  on 06/20/2022) 30 tablet 2   fluticasone (FLONASE) 50 MCG/ACT nasal spray Place 1 spray into both nostrils daily. (Patient not taking: Reported on 06/20/2022) 16 g 2   olopatadine (PATANOL) 0.1 % ophthalmic solution Place 1 drop into both eyes 2 (two) times daily. (Patient not taking: Reported on 06/20/2022) 5 mL 0   omeprazole (PRILOSEC) 20 MG capsule Take 1 capsule (20 mg total) by mouth daily. (Patient not taking: Reported on 06/20/2022) 30 capsule 1   ONDANSETRON HCL PO Take by mouth. (Patient not taking: Reported on 04/02/2022)     Prenat-Fe Poly-Methfol-FA-DHA (VITAFOL ULTRA) 29-0.6-0.4-200 MG CAPS Take 1 tablet by mouth daily. (Patient not taking: Reported on 04/02/2022) 30 capsule 12    ciprofloxacin-hydrocortisone (CIPRO HC OTIC) OTIC suspension Instill 3 drops into right ear twice daily for 7 days (Patient not taking: Reported on 04/24/2022) 10 mL 0   diclofenac Sodium (VOLTAREN) 1 % GEL Apply 4 g topically 4 (four) times daily. (Patient not taking: Reported on 06/20/2022) 50 g 0   ibuprofen (ADVIL) 600 MG tablet Take 1 tablet (600 mg total) by mouth every 6 (six) hours as needed. (Patient not taking: Reported on 04/02/2022) 30 tablet 0   meloxicam (MOBIC) 7.5 MG tablet Take 1 tablet (7.5 mg total) by mouth daily. (Patient not taking: Reported on 04/24/2022) 7 tablet 0   No facility-administered medications prior to visit.    Allergies  Allergen Reactions   Penicillins     ROS Review of Systems  Constitutional:  Negative for activity change, appetite change, chills, fatigue and fever.  HENT:  Positive for ear pain and hearing loss. Negative for congestion, dental problem and ear discharge.   Eyes:  Negative for pain, discharge, redness and itching.  Respiratory:  Negative for cough, chest tightness, shortness of breath and wheezing.   Cardiovascular:  Negative for chest pain, palpitations and leg swelling.  Gastrointestinal:  Negative for abdominal distention, abdominal pain, anal bleeding, blood in stool, constipation, diarrhea, nausea, rectal pain and vomiting.  Endocrine: Negative for cold intolerance, heat intolerance, polydipsia, polyphagia and polyuria.  Genitourinary:  Negative for difficulty urinating, dysuria, flank pain, frequency, hematuria, menstrual problem, pelvic pain and vaginal bleeding.  Musculoskeletal:  Positive for back pain. Negative for gait problem, joint swelling and myalgias.  Skin:  Negative for color change, pallor, rash and wound.  Allergic/Immunologic: Negative for environmental allergies, food allergies and immunocompromised state.  Neurological:  Negative for dizziness, tremors, facial asymmetry, weakness and headaches.  Hematological:   Negative for adenopathy. Does not bruise/bleed easily.  Psychiatric/Behavioral:  Negative for agitation, behavioral problems, confusion, decreased concentration, hallucinations, self-injury and suicidal ideas.       Objective:    Physical Exam Vitals and nursing note reviewed.  Constitutional:      General: She is not in acute distress.    Appearance: Normal appearance. She is obese. She is not ill-appearing, toxic-appearing or diaphoretic.  HENT:     Right Ear: Tympanic membrane, ear canal and external ear normal. There is no impacted cerumen.     Left Ear: Tympanic membrane, ear canal and external ear normal. There is no impacted cerumen.     Nose: Congestion present. No rhinorrhea.     Mouth/Throat:     Mouth: Mucous membranes are moist.     Pharynx: Oropharynx is clear. No oropharyngeal exudate or posterior oropharyngeal erythema.  Eyes:     General: No scleral icterus.       Right eye: No discharge.  Left eye: No discharge.     Extraocular Movements: Extraocular movements intact.     Conjunctiva/sclera: Conjunctivae normal.  Cardiovascular:     Rate and Rhythm: Normal rate and regular rhythm.     Pulses: Normal pulses.     Heart sounds: Normal heart sounds. No murmur heard.    No friction rub. No gallop.  Pulmonary:     Effort: Pulmonary effort is normal. No respiratory distress.     Breath sounds: Normal breath sounds. No stridor. No wheezing, rhonchi or rales.  Chest:     Chest wall: No tenderness.  Abdominal:     General: There is no distension.     Palpations: Abdomen is soft.     Tenderness: There is abdominal tenderness. There is no right CVA tenderness, left CVA tenderness or guarding.     Comments: Mild tenderness mid epigastric area on palpation  Musculoskeletal:        General: Tenderness present. No swelling, deformity or signs of injury.     Right lower leg: No edema.     Left lower leg: No edema.     Comments: Tenderness on palpation of mid low back  and range of motion of the spine, patient able to ambulate without difficulty, skin warm and dry  Skin:    General: Skin is warm and dry.     Capillary Refill: Capillary refill takes less than 2 seconds.     Coloration: Skin is not jaundiced or pale.     Findings: No bruising, erythema or lesion.  Neurological:     Mental Status: She is alert and oriented to person, place, and time.     Motor: No weakness.     Coordination: Coordination normal.     Gait: Gait normal.  Psychiatric:        Mood and Affect: Mood normal.        Behavior: Behavior normal.        Thought Content: Thought content normal.        Judgment: Judgment normal.     BP 116/75   Pulse 83   Temp (!) 97.3 F (36.3 C)   Ht 5\' 6"  (1.676 m)   Wt 164 lb 3.2 oz (74.5 kg)   LMP 05/20/2022   SpO2 100%   BMI 26.50 kg/m  Wt Readings from Last 3 Encounters:  06/20/22 164 lb 3.2 oz (74.5 kg)  04/02/22 165 lb 12.8 oz (75.2 kg)  10/14/21 162 lb (73.5 kg)    No results found for: "TSH" Lab Results  Component Value Date   WBC 6.0 06/10/2021   HGB 13.3 06/10/2021   HCT 39.6 06/10/2021   MCV 91 06/10/2021   PLT 277 06/10/2021   Lab Results  Component Value Date   NA 138 11/30/2020   K 3.9 11/30/2020   CO2 24 11/30/2020   GLUCOSE 93 11/30/2020   BUN 16 11/30/2020   CREATININE 0.74 11/30/2020   BILITOT 0.3 11/30/2020   ALKPHOS 71 11/30/2020   AST 15 11/30/2020   ALT 12 11/30/2020   PROT 7.4 11/30/2020   ALBUMIN 4.3 11/30/2020   CALCIUM 9.3 11/30/2020   EGFR 100 11/30/2020   Lab Results  Component Value Date   CHOL 190 11/30/2020   Lab Results  Component Value Date   HDL 58 11/30/2020   Lab Results  Component Value Date   LDLCALC 121 (H) 11/30/2020   Lab Results  Component Value Date   TRIG 56 11/30/2020   Lab Results  Component Value Date   CHOLHDL 3.3 11/30/2020   Lab Results  Component Value Date   HGBA1C 5.5 12/21/2020      Assessment & Plan:   Problem List Items Addressed This  Visit       Respiratory   Allergic rhinitis - Primary    She has Flonase nasal spray but she has not been using Patient encouraged to use Flonase nasal spray 1 spray daily into both nostrils.        Nervous and Auditory   Chronic low back pain with right-sided sciatica    Chronic condition Patient encouraged to continue Voltaren gel forming grams 4 times daily May take Tylenol over-the-counter Application of heat and ice encouraged Patient referred to orthopedics She refused Toradol injection today      Relevant Medications   diclofenac Sodium (VOLTAREN) 1 % GEL   Other Relevant Orders   Ambulatory referral to Orthopedic Surgery   Chronic otitis externa of right ear    Chronic condition No sign of infection noted today Dr. Legrand Rams office is closed so we will not able to follow-up on her referral.  Had documents were sent to the office today and number to the doctor's office provided. Will follow-up on this next week        Other   Abdominal pain    Epigastric abdominal pain much improved Patient encouraged to take omeprazole 20 mg daily Avoid fatty fried foods spicy foods      Screening for endocrine, nutritional, metabolic and immunity disorder   Relevant Orders   CBC with Differential/Platelet   CMP14+EGFR   Hemoglobin A1c   Hepatitis C antibody   TSH   VITAMIN D 25 Hydroxy (Vit-D Deficiency, Fractures)    Meds ordered this encounter  Medications   diclofenac Sodium (VOLTAREN) 1 % GEL    Sig: Apply 4 g topically 4 (four) times daily.    Dispense:  50 g    Refill:  3    Follow-up: Return in about 2 months (around 08/20/2022), or ear pain/back pain.    Donell Beers, FNP

## 2022-06-20 NOTE — Assessment & Plan Note (Signed)
Chronic condition No sign of infection noted today Dr. Legrand Rams office is closed so we will not able to follow-up on her referral.  Had documents were sent to the office today and number to the doctor's office provided. Will follow-up on this next week

## 2022-06-20 NOTE — Assessment & Plan Note (Signed)
Epigastric abdominal pain much improved Patient encouraged to take omeprazole 20 mg daily Avoid fatty fried foods spicy foods

## 2022-06-20 NOTE — Assessment & Plan Note (Signed)
She has Flonase nasal spray but she has not been using Patient encouraged to use Flonase nasal spray 1 spray daily into both nostrils.

## 2022-06-20 NOTE — Assessment & Plan Note (Signed)
Chronic condition Patient encouraged to continue Voltaren gel forming grams 4 times daily May take Tylenol over-the-counter Application of heat and ice encouraged Patient referred to orthopedics She refused Toradol injection today

## 2022-06-21 LAB — CBC WITH DIFFERENTIAL/PLATELET
Basophils Absolute: 0 10*3/uL (ref 0.0–0.2)
Basos: 1 %
EOS (ABSOLUTE): 0.6 10*3/uL — ABNORMAL HIGH (ref 0.0–0.4)
Eos: 10 %
Hematocrit: 41.5 % (ref 34.0–46.6)
Hemoglobin: 13.9 g/dL (ref 11.1–15.9)
Immature Grans (Abs): 0 10*3/uL (ref 0.0–0.1)
Immature Granulocytes: 0 %
Lymphocytes Absolute: 2.7 10*3/uL (ref 0.7–3.1)
Lymphs: 44 %
MCH: 30.5 pg (ref 26.6–33.0)
MCHC: 33.5 g/dL (ref 31.5–35.7)
MCV: 91 fL (ref 79–97)
Monocytes Absolute: 0.6 10*3/uL (ref 0.1–0.9)
Monocytes: 10 %
Neutrophils Absolute: 2.2 10*3/uL (ref 1.4–7.0)
Neutrophils: 35 %
Platelets: 288 10*3/uL (ref 150–450)
RBC: 4.55 x10E6/uL (ref 3.77–5.28)
RDW: 11.9 % (ref 11.7–15.4)
WBC: 6.1 10*3/uL (ref 3.4–10.8)

## 2022-06-21 LAB — CMP14+EGFR
ALT: 12 IU/L (ref 0–32)
AST: 15 IU/L (ref 0–40)
Albumin/Globulin Ratio: 1.3 (ref 1.2–2.2)
Albumin: 3.8 g/dL — ABNORMAL LOW (ref 3.9–4.9)
Alkaline Phosphatase: 69 IU/L (ref 44–121)
BUN/Creatinine Ratio: 15 (ref 9–23)
BUN: 9 mg/dL (ref 6–24)
Bilirubin Total: 0.2 mg/dL (ref 0.0–1.2)
CO2: 23 mmol/L (ref 20–29)
Calcium: 9.2 mg/dL (ref 8.7–10.2)
Chloride: 105 mmol/L (ref 96–106)
Creatinine, Ser: 0.59 mg/dL (ref 0.57–1.00)
Globulin, Total: 3 g/dL (ref 1.5–4.5)
Glucose: 91 mg/dL (ref 70–99)
Potassium: 4 mmol/L (ref 3.5–5.2)
Sodium: 140 mmol/L (ref 134–144)
Total Protein: 6.8 g/dL (ref 6.0–8.5)
eGFR: 110 mL/min/{1.73_m2} (ref >59)

## 2022-06-21 LAB — VITAMIN D 25 HYDROXY (VIT D DEFICIENCY, FRACTURES): Vit D, 25-Hydroxy: 14.3 ng/mL — ABNORMAL LOW (ref 30.0–100.0)

## 2022-06-21 LAB — HEMOGLOBIN A1C
Est. average glucose Bld gHb Est-mCnc: 120 mg/dL
Hgb A1c MFr Bld: 5.8 % — ABNORMAL HIGH (ref 4.8–5.6)

## 2022-06-21 LAB — HEPATITIS C ANTIBODY: Hep C Virus Ab: NONREACTIVE

## 2022-06-21 LAB — TSH: TSH: 0.985 u[IU]/mL (ref 0.450–4.500)

## 2022-06-23 ENCOUNTER — Ambulatory Visit: Payer: Medicaid Other | Admitting: Physical Medicine and Rehabilitation

## 2022-06-23 ENCOUNTER — Other Ambulatory Visit (INDEPENDENT_AMBULATORY_CARE_PROVIDER_SITE_OTHER): Payer: Medicaid Other

## 2022-06-23 ENCOUNTER — Telehealth: Payer: Self-pay

## 2022-06-23 ENCOUNTER — Other Ambulatory Visit: Payer: Self-pay | Admitting: Nurse Practitioner

## 2022-06-23 ENCOUNTER — Encounter: Payer: Self-pay | Admitting: Physician Assistant

## 2022-06-23 ENCOUNTER — Ambulatory Visit (INDEPENDENT_AMBULATORY_CARE_PROVIDER_SITE_OTHER): Payer: Medicaid Other | Admitting: Physician Assistant

## 2022-06-23 DIAGNOSIS — M5440 Lumbago with sciatica, unspecified side: Secondary | ICD-10-CM

## 2022-06-23 DIAGNOSIS — M545 Low back pain, unspecified: Secondary | ICD-10-CM | POA: Diagnosis not present

## 2022-06-23 DIAGNOSIS — R7303 Prediabetes: Secondary | ICD-10-CM

## 2022-06-23 DIAGNOSIS — H8101 Meniere's disease, right ear: Secondary | ICD-10-CM

## 2022-06-23 DIAGNOSIS — E559 Vitamin D deficiency, unspecified: Secondary | ICD-10-CM | POA: Insufficient documentation

## 2022-06-23 HISTORY — DX: Prediabetes: R73.03

## 2022-06-23 HISTORY — DX: Vitamin D deficiency, unspecified: E55.9

## 2022-06-23 MED ORDER — PREDNISONE 50 MG PO TABS: ORAL_TABLET | ORAL | 0 refills | Status: DC

## 2022-06-23 MED ORDER — VITAMIN D (ERGOCALCIFEROL) 1.25 MG (50000 UNIT) PO CAPS
50000.0000 [IU] | ORAL_CAPSULE | ORAL | 0 refills | Status: DC
Start: 2022-06-23 — End: 2022-08-20

## 2022-06-23 NOTE — Progress Notes (Signed)
Office Visit Note   Patient: Stacy Conner           Date of Birth: 07/26/1970           MRN: [id number] Visit Date: 06/23/2022              Requested by: Donell Beers, FNP [address] [place],,  [place] [address] PCP: Donell Beers, FNP   Assessment & Plan: Visit Diagnoses:  1. Acute right-sided low back pain, unspecified whether sciatica present   2. Acute bilateral low back pain with sciatica, sciatica laterality unspecified     Plan: Patient is a pleasant 52-year-old woman who I saw about a year ago for low back pain with right-sided symptoms.  At that time she was prescribed meloxicam and referred to physical therapy.  She said while she was doing the therapy her back pain decreased but now she has had return of acute symptoms in the last 4 to 5 days.  Describes her pain as moderate and has difficulty sleeping.  She actually has had pain going back to 2020 when she injured herself when she was still living in [place]..Denies any loss of bowel or bladder control.  Considering her failure of conservative treatment in the past and the length of time this has been going on I recommend an MRI of her low back with follow-up afterwards she does not have any loss of bowel or bladder control her exam is fairly benign.  She she does have radiation of her symptoms all the way down the lateral side of her leg to her foot.  In the meantime I will give her a short course of prednisone.  She knows to take it with food, discontinue it if she gets an upset stomach, and not to take any other anti-inflammatories with  Follow-Up Instructions: No follow-ups on file.   Orders:  Orders Placed This Encounter  Procedures   XR Lumbar Spine 2-3 Views   MR Lumbar Spine w/o contrast   Meds ordered this encounter  Medications   predniSONE (DELTASONE) 50 MG tablet    Sig: Take one daily with food until gone    Dispense:  5 tablet    Refill:  0      Procedures: No  procedures performed   Clinical Data: No additional findings.   Subjective: Chief Complaint  Patient presents with   Lower Back - Pain    HPI patient is a pleasant 52 year old woman with a long history of low back pain.  I originally saw her a year ago and prescribed meloxicam and some therapy.  She said this did help somewhat but the pain is now returned.  This is now been going on since 2020.  No loss of bowel or bladder control describes her pain as moderate and has difficulty sleeping  Review of Systems  All other systems reviewed and are negative.    Objective: Vital Signs: LMP 05/20/2022   Physical Exam Constitutional:      Appearance: Normal appearance.  HENT:     Head: Normocephalic.  Pulmonary:     Effort: Pulmonary effort is normal.  Neurological:     General: No focal deficit present.     Mental Status: She is alert.  Psychiatric:        Mood and Affect: Mood normal.        Behavior: Behavior normal.     Ortho Exam Examination of her low back she has no swelling  no redness.  She has good flexion and extension.  She has good side-to-side bending.  Her right leg sensation is intact today her strength is 5 out of 5 she does have radicular findings going down the side of her leg.  Compartments are soft and nontender.  She has good strength with resisted dorsiflexion plantarflexion extension and flexion of her legs and hips no real groin pain Specialty Comments:  No specialty comments available.  Imaging: No results found.   PMFS History: Patient Active Problem List   Diagnosis Date Noted   Prediabetes 06/23/2022   Vitamin D deficiency 06/23/2022   Allergic rhinitis 06/20/2022   Abdominal pain 06/20/2022   Screening for endocrine, nutritional, metabolic and immunity disorder 06/20/2022   Chronic otitis externa of right ear 04/02/2022   Health care maintenance 04/02/2022   Allergic conjunctivitis and rhinitis 04/02/2022   Low back pain 07/22/2021    Past Medical History:  Diagnosis Date   Chronic low back pain with right-sided sciatica     Family History  Family history unknown: Yes    Past Surgical History:  Procedure Laterality Date   NO PAST SURGERIES     Social History   Occupational History   Not on file  Tobacco Use   Smoking status: Never   Smokeless tobacco: Never  Vaping Use   Vaping Use: Never used  Substance and Sexual Activity   Alcohol use: Never   Drug use: Never   Sexual activity: Yes    Birth control/protection: None

## 2022-06-25 NOTE — Telephone Encounter (Signed)
I did check referral to see if is been approved still pending. Gh

## 2022-07-03 NOTE — Telephone Encounter (Signed)
Called pt and pt already been schedule. Gh

## 2022-07-29 ENCOUNTER — Ambulatory Visit (HOSPITAL_COMMUNITY)
Admission: EM | Admit: 2022-07-29 | Discharge: 2022-07-29 | Disposition: A | Discharge status: Home / Self Care | Payer: Medicaid Other | Attending: Family Medicine | Admitting: Family Medicine

## 2022-07-29 ENCOUNTER — Encounter (HOSPITAL_COMMUNITY): Payer: Self-pay | Admitting: Emergency Medicine

## 2022-07-29 DIAGNOSIS — G43911 Migraine, unspecified, intractable, with status migrainosus: Secondary | ICD-10-CM | POA: Diagnosis not present

## 2022-07-29 HISTORY — DX: Gastro-esophageal reflux disease without esophagitis: K21.9

## 2022-07-29 MED ORDER — SUMATRIPTAN SUCCINATE 6 MG/0.5ML ~~LOC~~ SOLN
6.0000 mg | Freq: Once | SUBCUTANEOUS | Status: AC
  Administered 2022-07-29: 6 mg via SUBCUTANEOUS

## 2022-07-29 MED ORDER — METOCLOPRAMIDE HCL 5 MG/ML IJ SOLN
5.0000 mg | Freq: Once | INTRAMUSCULAR | Status: AC
  Administered 2022-07-29: 5 mg via INTRAMUSCULAR

## 2022-07-29 MED ORDER — METOCLOPRAMIDE HCL 5 MG/ML IJ SOLN
INTRAMUSCULAR | Status: AC
  Filled 2022-07-29: qty 2

## 2022-07-29 MED ORDER — KETOROLAC TROMETHAMINE 30 MG/ML IJ SOLN
30.0000 mg | Freq: Once | INTRAMUSCULAR | Status: AC
  Administered 2022-07-29: 30 mg via INTRAMUSCULAR

## 2022-07-29 MED ORDER — SUMATRIPTAN SUCCINATE 6 MG/0.5ML ~~LOC~~ SOLN
SUBCUTANEOUS | Status: AC
  Filled 2022-07-29: qty 0.5

## 2022-07-29 MED ORDER — KETOROLAC TROMETHAMINE 30 MG/ML IJ SOLN
INTRAMUSCULAR | Status: AC
  Filled 2022-07-29: qty 1

## 2022-07-29 NOTE — Discharge Instructions (Signed)
You have been given injections of Toradol, metoclopramide, and sumatriptan ; these are for pain  If your pain is not relieved by these medications, please go to the emergency room.  Also follow-up with your primary care about this issue.

## 2022-07-29 NOTE — ED Triage Notes (Signed)
Pt c/o right eye pain and headache on right side x 4 days. Reports this has happened twice in past 18 months since being in [place]. Reports seen PCP and eye doctor who mentioned neurology or other specialist consult but hasn't happened yet. Took OTC medication for headache which made pain on left head go to right side. Pt reports sensitive to light.

## 2022-07-29 NOTE — ED Provider Notes (Signed)
[facility]    CSN: [id number] Arrival date & time: 07/29/22  1946      History   Chief Complaint Chief Complaint  Patient presents with   Eye Pain    HPI Stacy Conner is a 52 y.o. female.    Eye Pain   Here for severe right-sided headache.  Headache began a few days ago and at first it was on both sides of her head but now is on the right.  It sounds like it is throbbing some.  Light does make it worse.  She is nauseated though she is not throwing up.  She has not had any fever  She has seen her primary care about these headaches and is possibly been referred to a specialist.  She is allergic to penicillin.  Last menstrual cycle was June 7 Past Medical History:  Diagnosis Date   Chronic low back pain with right-sided sciatica    GERD (gastroesophageal reflux disease)     Patient Active Problem List   Diagnosis Date Noted   Prediabetes 06/23/2022   Vitamin D deficiency 06/23/2022   Allergic rhinitis 06/20/2022   Abdominal pain 06/20/2022   Screening for endocrine, nutritional, metabolic and immunity disorder 06/20/2022   Chronic otitis externa of right ear 04/02/2022   Health care maintenance 04/02/2022   Allergic conjunctivitis and rhinitis 04/02/2022   Low back pain 07/22/2021    Past Surgical History:  Procedure Laterality Date   NO PAST SURGERIES      OB History     Gravida  3   Para  3   Term  3   Preterm      AB      Living  3      SAB      IAB      Ectopic      Multiple      Live Births               Home Medications    Prior to Admission medications   Medication Sig Start Date End Date Taking? Authorizing Provider  acetaminophen (TYLENOL) 500 MG tablet Take 1 tablet (500 mg total) by mouth every 6 (six) hours as needed. 06/15/22   Carlisle Beers, FNP  diclofenac Sodium (VOLTAREN) 1 % GEL Apply 4 g topically 4 (four) times daily. 06/20/22   Donell Beers, FNP  omeprazole (PRILOSEC) 20  MG capsule Take 1 capsule (20 mg total) by mouth daily. 06/15/22   Carlisle Beers, FNP  ONDANSETRON HCL PO Take by mouth.    [provider]  Prenat-Fe Poly-Methfol-FA-DHA (VITAFOL ULTRA) 29-0.6-0.4-200 MG CAPS Take 1 tablet by mouth daily. 10/14/21   Constant, Peggy, MD  Vitamin D, Ergocalciferol, (DRISDOL) 1.25 MG (50000 UNIT) CAPS capsule Take 1 capsule (50,000 Units total) by mouth every 7 (seven) days. 06/23/22   PasedaBaird Kay, FNP    Family History Family History  Family history unknown: Yes    Social History Social History   Tobacco Use   Smoking status: Never   Smokeless tobacco: Never  Vaping Use   Vaping Use: Never used  Substance Use Topics   Alcohol use: Never   Drug use: Never     Allergies   Penicillins   Review of Systems Review of Systems  Eyes:  Positive for pain.     Physical Exam Triage Vital Signs ED Triage Vitals [07/29/22 2010]  Enc Vitals Group     BP 126/86  Pulse Rate 97     Resp 17     Temp 97.9 F (36.6 C)     Temp Source Oral     SpO2 98 %     Weight      Height      Head Circumference      Peak Flow      Pain Score 10     Pain Loc      Pain Edu?      Excl. in GC?    No data found.  Updated Vital Signs BP 126/86 (BP Location: Left Arm)   Pulse 97   Temp 97.9 F (36.6 C) (Oral)   Resp 17   LMP 07/25/2022   SpO2 98%   Visual Acuity Right Eye Distance:   Left Eye Distance:   Bilateral Distance:    Right Eye Near:   Left Eye Near:    Bilateral Near:     Physical Exam Vitals reviewed.  Constitutional:      General: She is not in acute distress.    Appearance: She is not ill-appearing, toxic-appearing or diaphoretic.  HENT:     Mouth/Throat:     Mouth: Mucous membranes are moist.     Pharynx: No oropharyngeal exudate or posterior oropharyngeal erythema.  Eyes:     Extraocular Movements: Extraocular movements intact.     Conjunctiva/sclera: Conjunctivae normal.     Pupils: Pupils are  equal, round, and reactive to light.  Cardiovascular:     Rate and Rhythm: Normal rate and regular rhythm.     Heart sounds: No murmur heard. Pulmonary:     Effort: Pulmonary effort is normal.     Breath sounds: Normal breath sounds.  Musculoskeletal:     Cervical back: Neck supple.  Lymphadenopathy:     Cervical: No cervical adenopathy.  Skin:    Coloration: Skin is not pale.  Neurological:     General: No focal deficit present.     Mental Status: She is alert and oriented to person, place, and time.     Cranial Nerves: No cranial nerve deficit.     Sensory: No sensory deficit.     Motor: No weakness.     Coordination: Coordination normal.     Gait: Gait normal.     Deep Tendon Reflexes: Reflexes normal.  Psychiatric:        Behavior: Behavior normal.      UC Treatments / Results  Labs (all labs ordered are listed, but only abnormal results are displayed) Labs Reviewed - No data to display  EKG   Radiology No results found.  Procedures Procedures (including critical care time)  Medications Ordered in UC Medications  ketorolac (TORADOL) 30 MG/ML injection 30 mg (has no administration in time range)  metoCLOPramide (REGLAN) injection 5 mg (has no administration in time range)  SUMAtriptan (IMITREX) injection 6 mg (has no administration in time range)    Initial Impression / Assessment and Plan / UC Course  I have reviewed the triage vital signs and the nursing notes.  Pertinent labs & imaging results that were available during my care of the patient were reviewed by me and considered in my medical decision making (see chart for details).       Visit is conducted with the help of an Arabic interpreter. Much of this sounds like a migraine headache.  Toradol, Reglan, and Imitrex are given an injection here.  I have asked her to proceed to the emergency room if her pain  is not relieved with this treatment.  Also of asked her to follow-up with her primary care  about this issue Final Clinical Impressions(s) / UC Diagnoses   Final diagnoses:  Intractable migraine with status migrainosus, unspecified migraine type     Discharge Instructions      You have been given injections of Toradol, metoclopramide, and sumatriptan ; these are for pain  If your pain is not relieved by these medications, please go to the emergency room.  Also follow-up with your primary care about this issue.    ED Prescriptions   None    PDMP not reviewed this encounter.   Zenia Resides, MD 07/29/22 2027

## 2022-08-20 ENCOUNTER — Ambulatory Visit: Payer: Medicaid Other | Admitting: Nurse Practitioner

## 2022-08-20 ENCOUNTER — Encounter: Payer: Self-pay | Admitting: Nurse Practitioner

## 2022-08-20 VITALS — BP 134/82 | HR 91 | Temp 97.5°F | Ht 66.0 in | Wt 169.6 lb

## 2022-08-20 DIAGNOSIS — Z1211 Encounter for screening for malignant neoplasm of colon: Secondary | ICD-10-CM | POA: Diagnosis not present

## 2022-08-20 DIAGNOSIS — E559 Vitamin D deficiency, unspecified: Secondary | ICD-10-CM | POA: Diagnosis not present

## 2022-08-20 DIAGNOSIS — G43111 Migraine with aura, intractable, with status migrainosus: Secondary | ICD-10-CM | POA: Diagnosis not present

## 2022-08-20 DIAGNOSIS — H6061 Unspecified chronic otitis externa, right ear: Secondary | ICD-10-CM | POA: Diagnosis not present

## 2022-08-20 DIAGNOSIS — K219 Gastro-esophageal reflux disease without esophagitis: Secondary | ICD-10-CM

## 2022-08-20 HISTORY — DX: Migraine with aura, intractable, with status migrainosus: G43.111

## 2022-08-20 MED ORDER — ACETAMINOPHEN 500 MG PO TABS
500.0000 mg | ORAL_TABLET | Freq: Four times a day (QID) | ORAL | 0 refills | Status: AC | PRN
Start: 2022-08-20 — End: ?

## 2022-08-20 MED ORDER — OMEPRAZOLE 20 MG PO CPDR: 20.0000 mg | DELAYED_RELEASE_CAPSULE | Freq: Every day | ORAL | 1 refills | Status: DC

## 2022-08-20 MED ORDER — VITAMIN D (ERGOCALCIFEROL) 1.25 MG (50000 UNIT) PO CAPS: 50000.0000 [IU] | ORAL_CAPSULE | ORAL | 0 refills | Status: DC

## 2022-08-20 NOTE — Progress Notes (Addendum)
Established Patient Office Visit  Subjective:  Patient ID: Stacy Conner, female    DOB: Jun 20, 1970  Age: 52 y.o. MRN: [id number]  CC:  Chief Complaint  Patient presents with   Follow-up    Hospital follow up stomach pain , and headaches.    HPI Stacy Conner is a 52 y.o. female  has a past medical history of Chronic low back pain with right-sided sciatica and GERD (gastroesophageal reflux disease).  Patient presents for hospital follow-up for headache .  Patient was at the emergency room on 07/29/2022 for complaints of severe right-sided headache.  Patient was given Toradol, Reglan and Imitrex injection.  Stated that she still has headache sometimes about every other day.  She is not taking any medication.  She has upcoming appointment with the ophthalmologist and ENT.  Patient encouraged to keep both appointments.  Interpretation services provided via [employer] iPad     Past Medical History:  Diagnosis Date   Chronic low back pain with right-sided sciatica    GERD (gastroesophageal reflux disease)     Past Surgical History:  Procedure Laterality Date   NO PAST SURGERIES      Family History  Family history unknown: Yes    Social History   Socioeconomic History   Marital status: Married    Spouse name: Not on file   Number of children: 3   Years of education: Not on file   Highest education level: Not on file  Occupational History   Not on file  Tobacco Use   Smoking status: Never   Smokeless tobacco: Never  Vaping Use   Vaping Use: Never used  Substance and Sexual Activity   Alcohol use: Never   Drug use: Never   Sexual activity: Yes    Birth control/protection: None  Other Topics Concern   Not on file  Social History Narrative   Not on file   Social Determinants of Health   Financial Resource Strain: Not on file  Food Insecurity: Food Insecurity Present (04/24/2022)   Hunger Vital Sign    Worried About Running Out of Food in  the Last Year: Sometimes true    Ran Out of Food in the Last Year: Sometimes true  Transportation Needs: No Transportation Needs (04/24/2022)   PRAPARE - Administrator, Civil Service (Medical): No    Lack of Transportation (Non-Medical): No  Physical Activity: Not on file  Stress: Not on file  Social Connections: Not on file  Intimate Partner Violence: Not on file    Outpatient Medications Prior to Visit  Medication Sig Dispense Refill   diclofenac Sodium (VOLTAREN) 1 % GEL Apply 4 g topically 4 (four) times daily. (Patient not taking: Reported on 08/20/2022) 50 g 3   ONDANSETRON HCL PO Take by mouth. (Patient not taking: Reported on 08/20/2022)     Prenat-Fe Poly-Methfol-FA-DHA (VITAFOL ULTRA) 29-0.6-0.4-200 MG CAPS Take 1 tablet by mouth daily. (Patient not taking: Reported on 08/20/2022) 30 capsule 12   acetaminophen (TYLENOL) 500 MG tablet Take 1 tablet (500 mg total) by mouth every 6 (six) hours as needed. (Patient not taking: Reported on 08/20/2022) 30 tablet 0   omeprazole (PRILOSEC) 20 MG capsule Take 1 capsule (20 mg total) by mouth daily. (Patient not taking: Reported on 08/20/2022) 30 capsule 1   Vitamin D, Ergocalciferol, (DRISDOL) 1.25 MG (50000 UNIT) CAPS capsule Take 1 capsule (50,000 Units total) by mouth every 7 (seven) days. (Patient not taking: Reported on 08/20/2022)  8 capsule 0   No facility-administered medications prior to visit.    Allergies  Allergen Reactions   Penicillins     ROS Review of Systems  Constitutional:  Negative for activity change, appetite change, chills, fatigue and fever.  HENT:  Positive for ear pain. Negative for congestion, dental problem, ear discharge, hearing loss, rhinorrhea, sinus pressure, sinus pain, sneezing and sore throat.        Chronic  Eyes:  Negative for pain, discharge and itching.  Respiratory:  Negative for cough, chest tightness, shortness of breath and wheezing.   Cardiovascular:  Negative for chest pain, palpitations  and leg swelling.  Gastrointestinal:  Positive for abdominal pain. Negative for abdominal distention, anal bleeding, blood in stool, constipation, diarrhea, nausea, rectal pain and vomiting.  Genitourinary:  Negative for difficulty urinating, dysuria and flank pain.  Skin:  Negative for color change, pallor, rash and wound.  Neurological:  Positive for headaches. Negative for dizziness, tremors, facial asymmetry and weakness.       Not currently  Hematological:  Negative for adenopathy. Does not bruise/bleed easily.  Psychiatric/Behavioral:  Negative for agitation, behavioral problems, confusion, decreased concentration, hallucinations, self-injury and suicidal ideas.       Objective:    Physical Exam Vitals and nursing note reviewed.  Constitutional:      General: She is not in acute distress.    Appearance: Normal appearance. She is not ill-appearing, toxic-appearing or diaphoretic.  HENT:     Mouth/Throat:     Mouth: Mucous membranes are moist.     Pharynx: Oropharynx is clear. No oropharyngeal exudate or posterior oropharyngeal erythema.  Eyes:     General: No scleral icterus.       Right eye: No discharge.        Left eye: No discharge.     Extraocular Movements: Extraocular movements intact.     Conjunctiva/sclera: Conjunctivae normal.  Cardiovascular:     Rate and Rhythm: Normal rate and regular rhythm.     Pulses: Normal pulses.     Heart sounds: Normal heart sounds. No murmur heard.    No friction rub. No gallop.  Pulmonary:     Effort: Pulmonary effort is normal. No respiratory distress.     Breath sounds: Normal breath sounds. No stridor. No wheezing, rhonchi or rales.  Chest:     Chest wall: No tenderness.  Abdominal:     General: There is no distension.     Palpations: Abdomen is soft.     Tenderness: There is abdominal tenderness. There is no right CVA tenderness, left CVA tenderness or guarding.     Comments: Mild mid epigastric tenderness on palpation   Musculoskeletal:        General: No deformity or signs of injury.     Right lower leg: No edema.     Left lower leg: No edema.  Skin:    General: Skin is warm and dry.     Capillary Refill: Capillary refill takes less than 2 seconds.     Coloration: Skin is not jaundiced or pale.     Findings: No bruising, erythema or lesion.  Neurological:     Mental Status: She is alert and oriented to person, place, and time.     Motor: No weakness.     Coordination: Coordination normal.     Gait: Gait normal.  Psychiatric:        Mood and Affect: Mood normal.        Behavior: Behavior normal.  Thought Content: Thought content normal.        Judgment: Judgment normal.     BP 134/82   Pulse 91   Temp (!) 97.5 F (36.4 C)   Ht 5\' 6"  (1.676 m)   Wt 169 lb 9.6 oz (76.9 kg)   LMP 07/25/2022 (Approximate)   SpO2 100%   BMI 27.37 kg/m  Wt Readings from Last 3 Encounters:  08/20/22 169 lb 9.6 oz (76.9 kg)  06/20/22 164 lb 3.2 oz (74.5 kg)  04/02/22 165 lb 12.8 oz (75.2 kg)    Lab Results  Component Value Date   TSH 0.985 06/20/2022   Lab Results  Component Value Date   WBC 6.1 06/20/2022   HGB 13.9 06/20/2022   HCT 41.5 06/20/2022   MCV 91 06/20/2022   PLT 288 06/20/2022   Lab Results  Component Value Date   NA 140 06/20/2022   K 4.0 06/20/2022   CO2 23 06/20/2022   GLUCOSE 91 06/20/2022   BUN 9 06/20/2022   CREATININE 0.59 06/20/2022   BILITOT 0.2 06/20/2022   ALKPHOS 69 06/20/2022   AST 15 06/20/2022   ALT 12 06/20/2022   PROT 6.8 06/20/2022   ALBUMIN 3.8 (L) 06/20/2022   CALCIUM 9.2 06/20/2022   EGFR 110 06/20/2022   Lab Results  Component Value Date   CHOL 190 11/30/2020   Lab Results  Component Value Date   HDL 58 11/30/2020   Lab Results  Component Value Date   LDLCALC 121 (H) 11/30/2020   Lab Results  Component Value Date   TRIG 56 11/30/2020   Lab Results  Component Value Date   CHOLHDL 3.3 11/30/2020   Lab Results  Component Value  Date   HGBA1C 5.8 (H) 06/20/2022      Assessment & Plan:   Problem List Items Addressed This Visit       Cardiovascular and Mediastinum   Intractable migraine with aura with status migrainosus - Primary    Currently feels better not taking any medication currently Patient told to take Tylenol 500 mg every 6 hours as needed Follow-up with ENT and ophthalmologist as planned      Relevant Medications   acetaminophen (TYLENOL) 500 MG tablet     Digestive   GERD (gastroesophageal reflux disease)    Has mild epigastric tenderness on palpation Omeprazole 20 mg daily refilled Education on GERD provided Due for colon cancer screening patient referred to GI      Relevant Medications   omeprazole (PRILOSEC) 20 MG capsule     Nervous and Auditory   Chronic otitis externa of right ear    Continues to have intermittent pain in the right thigh She now has an appointment with ENT She was encouraged to keep the appointment        Other   Vitamin D deficiency    She did not pick up the prescription for vitamin D 50,000 units once weekly that was ordered Patient encouraged to pick up the prescription and take as ordered Last vitamin D Lab Results  Component Value Date   VD25OH 14.3 (L) 06/20/2022         Relevant Medications   Vitamin D, Ergocalciferol, (DRISDOL) 1.25 MG (50000 UNIT) CAPS capsule   Screening for colon cancer   Relevant Orders   Ambulatory referral to Gastroenterology    Meds ordered this encounter  Medications   omeprazole (PRILOSEC) 20 MG capsule    Sig: Take 1 capsule (20 mg total) by mouth daily.  Dispense:  90 capsule    Refill:  1   Vitamin D, Ergocalciferol, (DRISDOL) 1.25 MG (50000 UNIT) CAPS capsule    Sig: Take 1 capsule (50,000 Units total) by mouth every 7 (seven) days.    Dispense:  8 capsule    Refill:  0   acetaminophen (TYLENOL) 500 MG tablet    Sig: Take 1 tablet (500 mg total) by mouth every 6 (six) hours as needed.    Dispense:   30 tablet    Refill:  0    Follow-up: Return in about 4 months (around 12/21/2022).    Donell Beers, FNP

## 2022-08-20 NOTE — Assessment & Plan Note (Signed)
Currently feels better not taking any medication currently Patient told to take Tylenol 500 mg every 6 hours as needed Follow-up with ENT and ophthalmologist as planned

## 2022-08-20 NOTE — Assessment & Plan Note (Signed)
Continues to have intermittent pain in the right thigh She now has an appointment with ENT She was encouraged to keep the appointment

## 2022-08-20 NOTE — Assessment & Plan Note (Signed)
She did not pick up the prescription for vitamin D 50,000 units once weekly that was ordered Patient encouraged to pick up the prescription and take as ordered Last vitamin D Lab Results  Component Value Date   VD25OH 14.3 (L) 06/20/2022

## 2022-08-20 NOTE — Patient Instructions (Addendum)
1. Epigastric pain  - Ambulatory referral to Gastroenterology - omeprazole (PRILOSEC) 20 MG capsule; Take 1 capsule (20 mg total) by mouth daily.  Dispense: 90 capsule; Refill: 1  2. Screening for colon cancer  - Ambulatory referral to Gastroenterology  3. Vitamin D deficiency  - Vitamin D, Ergocalciferol, (DRISDOL) 1.25 MG (50000 UNIT) CAPS capsule; Take 1 capsule (50,000 Units total) by mouth every 7 (seven) days.  Dispense: 8 capsule; Refill: 0  4. Intractable migraine with aura with status migrainosus  - acetaminophen (TYLENOL) 500 MG tablet; Take 1 tablet (500 mg total) by mouth every 6 (six) hours as needed.  Dispense: 30 tablet; Refill: 0   It is important that you exercise regularly at least 30 minutes 5 times a week as tolerated  Think about what you will eat, plan ahead. Choose " clean, green, fresh or frozen" over canned, processed or packaged foods which are more sugary, salty and fatty. 70 to 75% of food eaten should be vegetables and fruit. Three meals at set times with snacks allowed between meals, but they must be fruit or vegetables. Aim to eat over a 12 hour period , example 7 am to 7 pm, and STOP after  your last meal of the day. Drink water,generally about 64 ounces per day, no other drink is as healthy. Fruit juice is best enjoyed in a healthy way, by EATING the fruit.  Thanks for choosing [facility] we consider it a privelige to serve you.

## 2022-08-20 NOTE — Assessment & Plan Note (Signed)
Has mild epigastric tenderness on palpation Omeprazole 20 mg daily refilled Education on GERD provided Due for colon cancer screening patient referred to GI

## 2022-09-01 IMAGING — US US PELVIS COMPLETE WITH TRANSVAGINAL
1 series · 15 of 25 positions shown · non-contrast
Comparison: None Available.

CLINICAL DATA: Pelvic pain

EXAM:
TRANSABDOMINAL AND TRANSVAGINAL ULTRASOUND OF PELVIS
TECHNIQUE: Both transabdominal and transvaginal ultrasound examinations of the
pelvis were performed. Transabdominal technique was performed for
global imaging of the pelvis including uterus, ovaries, adnexal
regions, and pelvic cul-de-sac. It was necessary to proceed with
endovaginal exam following the transabdominal exam to visualize the
adnexal structures and endometrium.

[Series 1: us pelvis complete with transvaginal · 118 acquisitions, 15 frames shown]
[im 1/118]
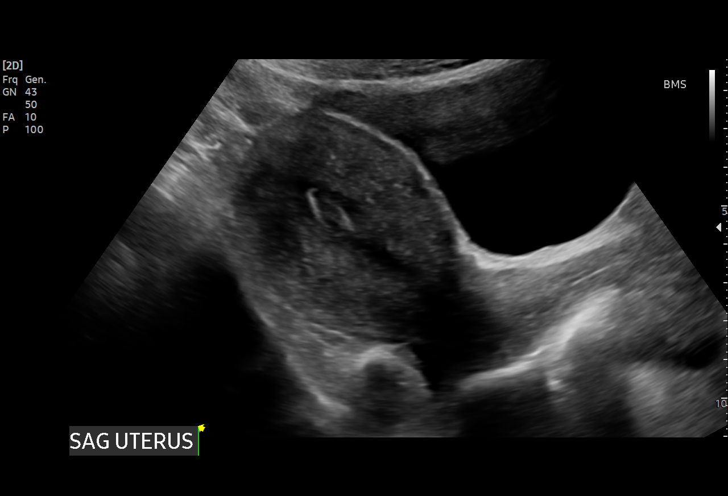
[im 10/118]
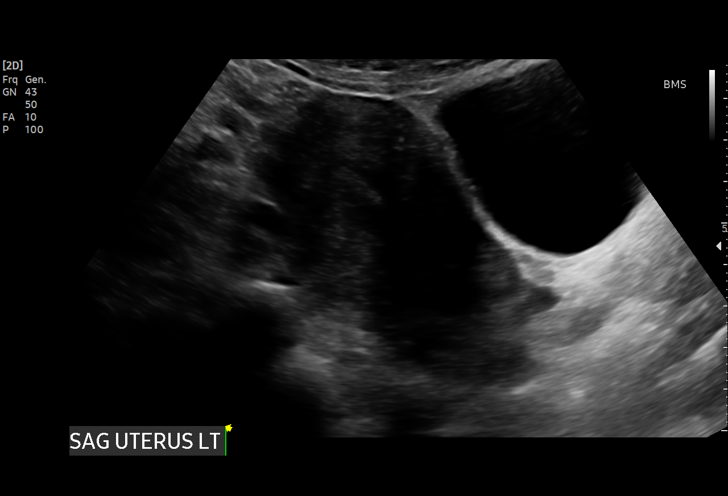
[im 20/118]
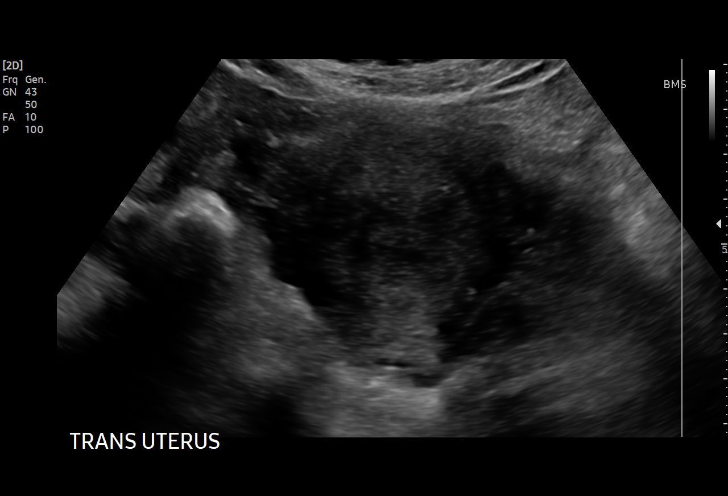
[im 25/118]
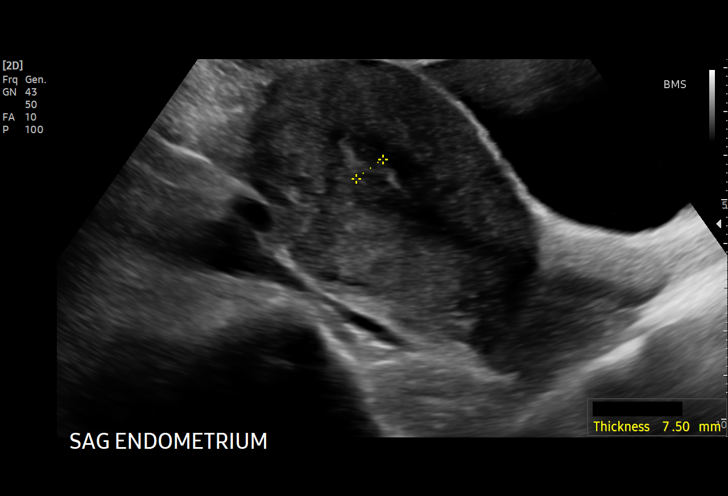
[im 35/118]
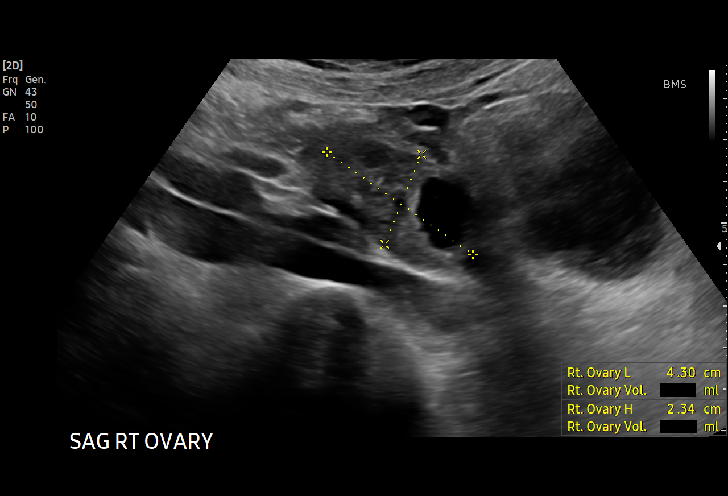
[im 44/118]
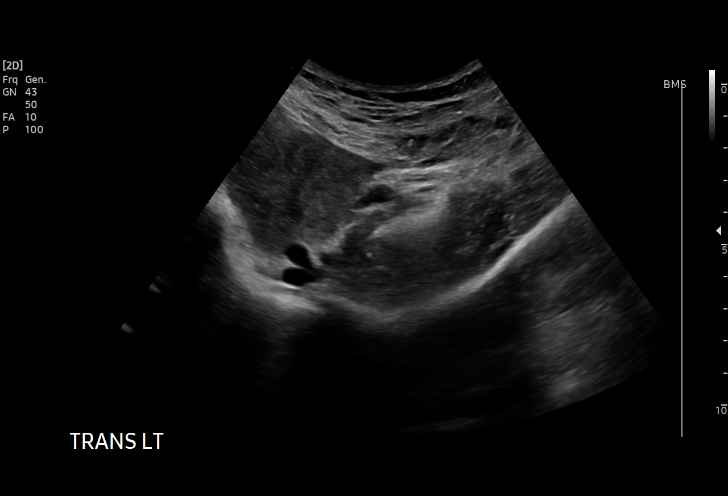
[im 49/118]
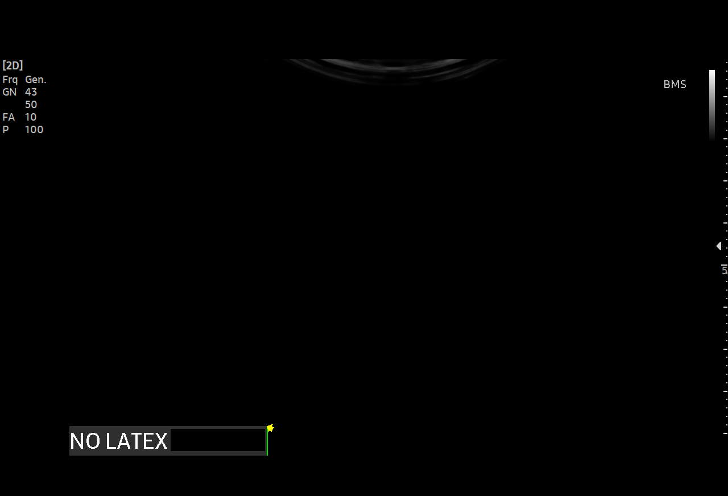
[im 59/118]
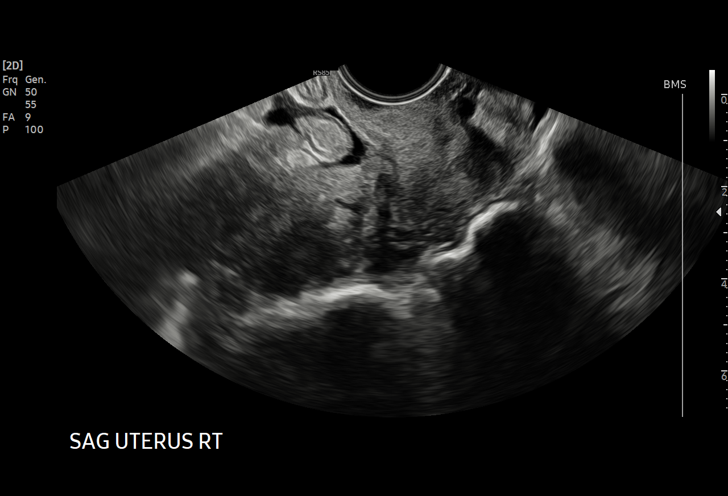
[im 69/118]
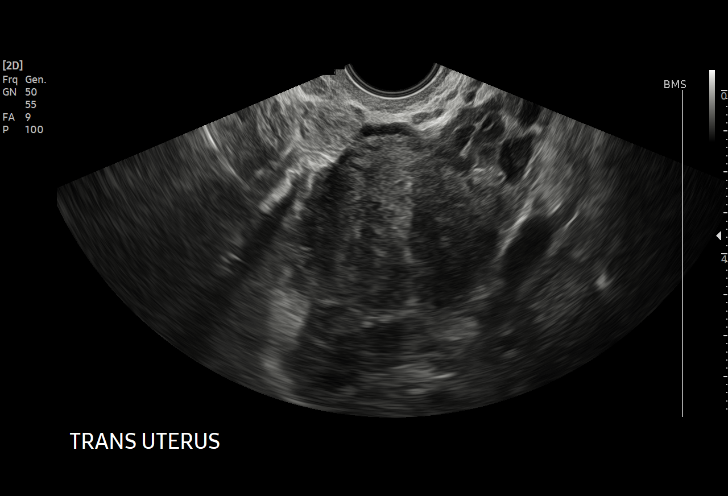
[im 74/118]
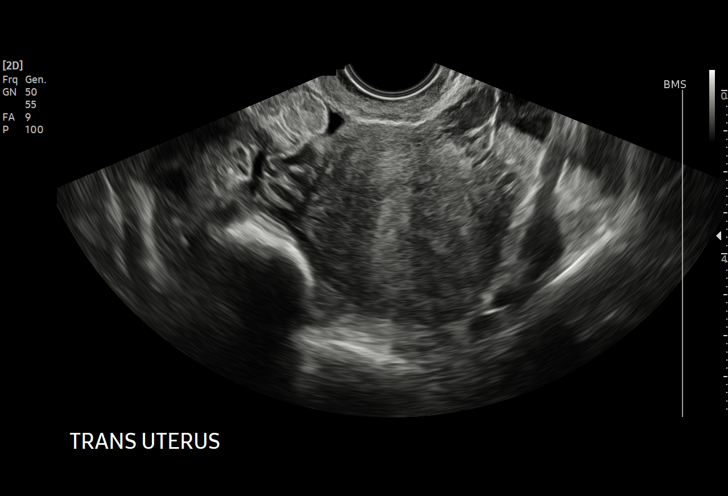
[im 83/118]
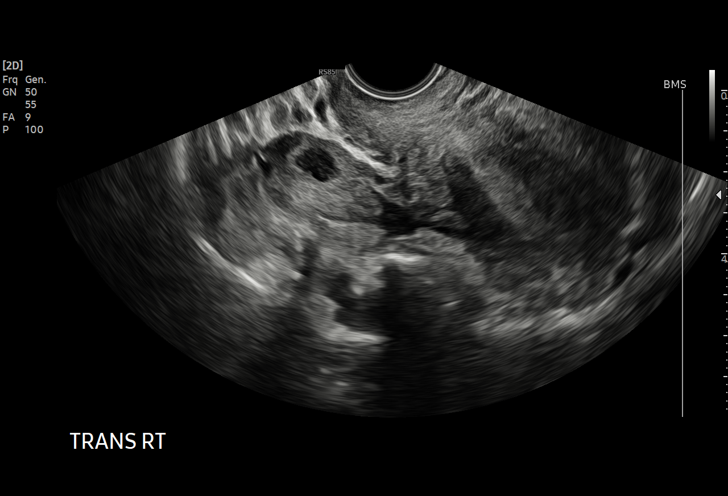
[im 93/118]
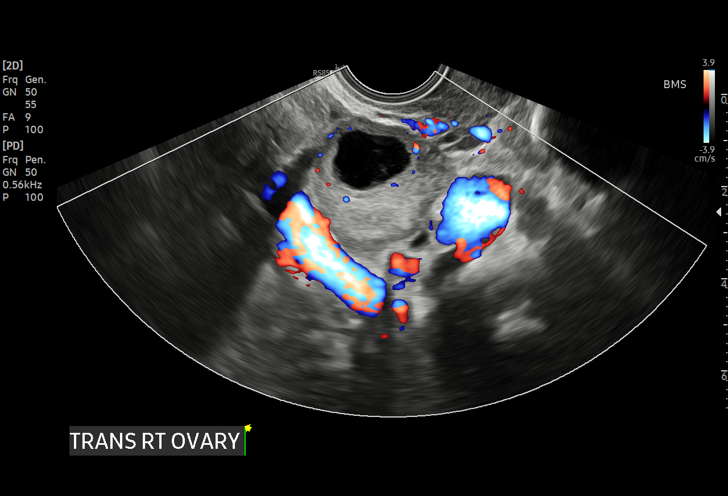
[im 98/118]
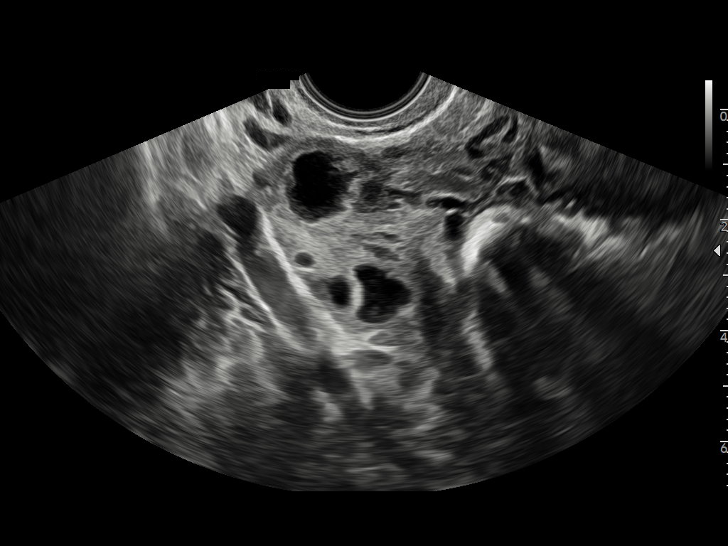
[im 108/118]
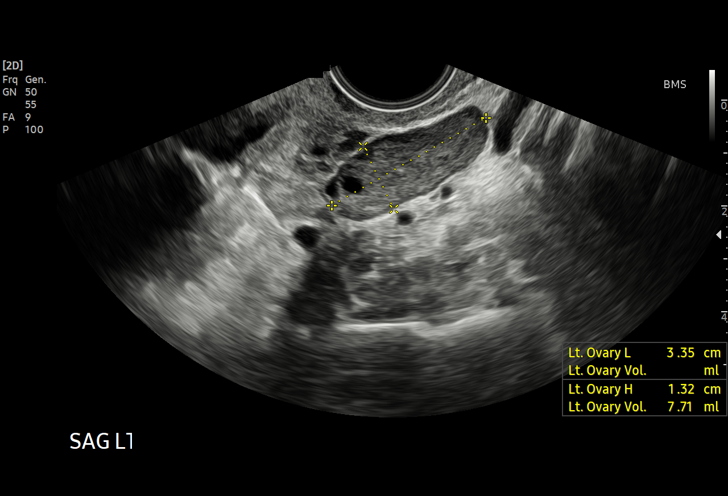
[im 118/118]
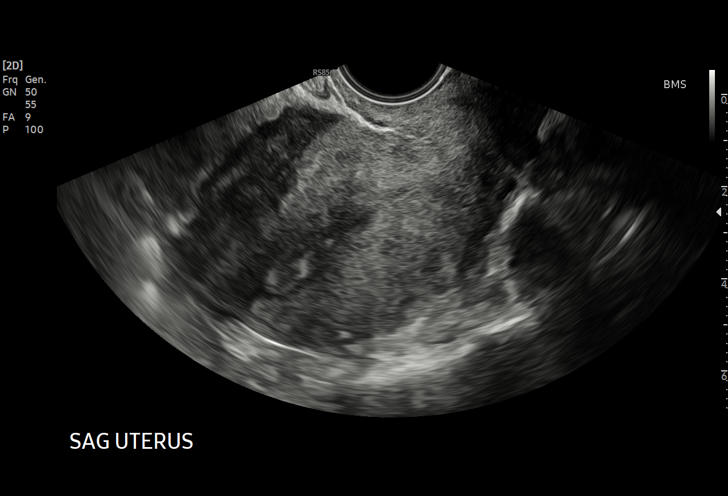

[15 of 25 positions shown; findings below may reference images not displayed]

FINDINGS: Uterus

Measurements: 8.6 x 5.5 by 5.8 cm = volume: 142.1 mL. No fibroids or
other mass visualized.

Endometrium

Thickness: 8 mm.  No focal abnormality visualized.

Right ovary

Measurements: 4.2 x 2.4 by 3.1 cm = volume: 16.2 mL. Normal
follicles. No adnexal masses.

Left ovary

Measurements: 3.4 x 1.3 x 2.2 cm = volume: 5.0 mL. Normal follicles.
No adnexal masses.

Other findings

Trace pelvic free fluid.
IMPRESSION: 1. Age-appropriate pelvic ultrasound. Trace pelvic free fluid is
likely physiologic.

## 2022-09-09 DIAGNOSIS — H9201 Otalgia, right ear: Secondary | ICD-10-CM | POA: Insufficient documentation

## 2022-09-09 DIAGNOSIS — H6991 Unspecified Eustachian tube disorder, right ear: Secondary | ICD-10-CM | POA: Insufficient documentation

## 2022-10-07 ENCOUNTER — Encounter (HOSPITAL_COMMUNITY): Payer: Self-pay | Admitting: Emergency Medicine

## 2022-10-07 ENCOUNTER — Ambulatory Visit (HOSPITAL_COMMUNITY)
Admission: EM | Admit: 2022-10-07 | Discharge: 2022-10-07 | Disposition: A | Discharge status: Home / Self Care | Payer: Medicaid Other

## 2022-10-07 DIAGNOSIS — J302 Other seasonal allergic rhinitis: Secondary | ICD-10-CM | POA: Diagnosis not present

## 2022-10-07 MED ORDER — CETIRIZINE HCL 10 MG PO TABS: 10.0000 mg | ORAL_TABLET | Freq: Every day | ORAL | 0 refills | Status: DC

## 2022-10-07 NOTE — ED Triage Notes (Signed)
Pt reports has allergies and takes Cetrizine which helps but out. Pt has bottle with her, and has refills through Jan 2025, informed pt that can contact pharmacy for refill. Pt replies that she can't call them due to not speaking english.   Pt also reports that her Omeprazole makes her SOB and it "must have penicillin in it". Hasn't made her PCP aware.

## 2022-10-07 NOTE — ED Provider Notes (Signed)
[facility]    CSN: [id number] Arrival date & time: 10/07/22  1009      History   Chief Complaint Chief Complaint  Patient presents with   Allergies   Medication Reaction    HPI Stacy Conner is a 52 y.o. female.   Patient presents today with a several day history of URI symptoms including congestion, rhinorrhea.  She is Arabic speaking and interpreter is utilized during visit.  She reports that symptoms began after she was cleaning the house and exposed to dust which is a common trigger of her allergies.  She has previously been prescribed cetirizine but has not been taking this medication as she ran out of it.  She has not tried any additional over-the-counter medication for symptom management.  She has no concern for viral illness including COVID as they do not have any visitors and stay at home.  She does have a history of seasonal allergy symptoms including eustachian tube dysfunction and has seen ENT in the past.  Denies any recent antibiotics or steroids.  She has no concern for pregnancy.    Past Medical History:  Diagnosis Date   Chronic low back pain with right-sided sciatica    GERD (gastroesophageal reflux disease)     Patient Active Problem List   Diagnosis Date Noted   Screening for colon cancer 08/20/2022   Intractable migraine with aura with status migrainosus 08/20/2022   Prediabetes 06/23/2022   Vitamin D deficiency 06/23/2022   Allergic rhinitis 06/20/2022   GERD (gastroesophageal reflux disease) 06/20/2022   Screening for endocrine, nutritional, metabolic and immunity disorder 06/20/2022   Chronic otitis externa of right ear 04/02/2022   Health care maintenance 04/02/2022   Allergic conjunctivitis and rhinitis 04/02/2022   Low back pain 07/22/2021    Past Surgical History:  Procedure Laterality Date   NO PAST SURGERIES      OB History     Gravida  3   Para  3   Term  3   Preterm      AB      Living  3      SAB       IAB      Ectopic      Multiple      Live Births               Home Medications    Prior to Admission medications   Medication Sig Start Date End Date Taking? Authorizing Provider  acetaminophen (TYLENOL) 500 MG tablet Take 1 tablet (500 mg total) by mouth every 6 (six) hours as needed. 08/20/22   Donell Beers, FNP  cetirizine (ZYRTEC) 10 MG tablet Take 1 tablet (10 mg total) by mouth daily. 10/07/22   Maui Britten, Noberto Retort, PA-C  diclofenac Sodium (VOLTAREN) 1 % GEL Apply 4 g topically 4 (four) times daily. Patient not taking: Reported on 08/20/2022 06/20/22   Donell Beers, FNP  omeprazole (PRILOSEC) 20 MG capsule Take 1 capsule (20 mg total) by mouth daily. 08/20/22   Donell Beers, FNP  ONDANSETRON HCL PO Take by mouth. Patient not taking: Reported on 08/20/2022    [provider]  Prenat-Fe Poly-Methfol-FA-DHA (VITAFOL ULTRA) 29-0.6-0.4-200 MG CAPS Take 1 tablet by mouth daily. Patient not taking: Reported on 08/20/2022 10/14/21   Constant, Peggy, MD  Vitamin D, Ergocalciferol, (DRISDOL) 1.25 MG (50000 UNIT) CAPS capsule Take 1 capsule (50,000 Units total) by mouth every 7 (seven) days. 08/20/22   Edwin Dada  R, FNP    Family History Family History  Family history unknown: Yes    Social History Social History   Tobacco Use   Smoking status: Never   Smokeless tobacco: Never  Vaping Use   Vaping status: Never Used  Substance Use Topics   Alcohol use: Never   Drug use: Never     Allergies   Penicillins   Review of Systems Review of Systems  Constitutional:  Negative for activity change, appetite change, fatigue and fever.  HENT:  Positive for rhinorrhea and sneezing. Negative for congestion, sinus pressure and sore throat.   Eyes:  Positive for itching.  Respiratory:  Negative for cough and shortness of breath.   Cardiovascular:  Negative for chest pain.  Gastrointestinal:  Negative for abdominal pain, diarrhea, nausea and vomiting.   Endocrine: Negative for polydipsia, polyphagia and polyuria.     Physical Exam Triage Vital Signs ED Triage Vitals  Encounter Vitals Group     BP 10/07/22 1138 120/75     Systolic BP Percentile --      Diastolic BP Percentile --      Pulse Rate 10/07/22 1138 90     Resp 10/07/22 1138 17     Temp 10/07/22 1138 98.5 F (36.9 C)     Temp Source 10/07/22 1138 Oral     SpO2 10/07/22 1138 98 %     Weight --      Height --      Head Circumference --      Peak Flow --      Pain Score 10/07/22 1139 0     Pain Loc --      Pain Education --      Exclude from Growth Chart --    No data found.  Updated Vital Signs BP 120/75 (BP Location: Left Arm)   Pulse 90   Temp 98.5 F (36.9 C) (Oral)   Resp 17   LMP 09/22/2022 (Approximate)   SpO2 98%   Visual Acuity Right Eye Distance:   Left Eye Distance:   Bilateral Distance:    Right Eye Near:   Left Eye Near:    Bilateral Near:     Physical Exam Vitals reviewed.  Constitutional:      General: She is awake. She is not in acute distress.    Appearance: Normal appearance. She is well-developed. She is not ill-appearing.     Comments: Very pleasant female appears stated age in no acute distress sitting comfortably in exam room  HENT:     Head: Normocephalic and atraumatic.     Right Ear: Tympanic membrane, ear canal and external ear normal. Tympanic membrane is not erythematous or bulging.     Left Ear: Tympanic membrane, ear canal and external ear normal. Tympanic membrane is not erythematous or bulging.     Nose: Nose normal.     Right Sinus: No maxillary sinus tenderness or frontal sinus tenderness.     Left Sinus: No maxillary sinus tenderness or frontal sinus tenderness.     Mouth/Throat:     Pharynx: Uvula midline. Postnasal drip present. No oropharyngeal exudate or posterior oropharyngeal erythema.  Cardiovascular:     Rate and Rhythm: Normal rate and regular rhythm.     Heart sounds: Normal heart sounds, S1 normal and  S2 normal. No murmur heard. Pulmonary:     Effort: Pulmonary effort is normal.     Breath sounds: Normal breath sounds. No wheezing, rhonchi or rales.     Comments:  Clear to auscultation bilaterally Psychiatric:        Behavior: Behavior is cooperative.      UC Treatments / Results  Labs (all labs ordered are listed, but only abnormal results are displayed) Labs Reviewed - No data to display  EKG   Radiology No results found.  Procedures Procedures (including critical care time)  Medications Ordered in UC Medications - No data to display  Initial Impression / Assessment and Plan / UC Course  I have reviewed the triage vital signs and the nursing notes.  Pertinent labs & imaging results that were available during my care of the patient were reviewed by me and considered in my medical decision making (see chart for details).     Patient is well-appearing, afebrile, nontoxic, nontachycardic.  No evidence of acute infection on physical exam that want initiation of antibiotics.  Suspect seasonal allergies.  We did discuss her symptoms could relate to a viral illness and offered COVID testing which she declined as she had no specific insert for exposure and states current symptoms are similar to previous episodes of allergies.  Refill of cetirizine was sent to pharmacy and she can use other over-the-counter medication for additional symptom relief.  Discussed that if she develops any worsening or changing symptoms she should return for reevaluation.  All questions were answered to patient satisfaction.  Recommend close follow-up with primary care; patient did report that she was not happy with her current primary care so placed referral to PCP assistance of the hopes of establishing her with someone else.  During intake she reported being thirsty and we discussed potential need to screen for diabetes.  Offered blood sugar and UA.  On further discussion with translator this was  clarified and that she is sneezing and not feeling thirsty.  She denies any polyuria, polydipsia, polyphasia and so additional workup was deferred.  During intake patient told triage nurse that she stopped taking her omeprazole due to side effects.  It is difficult to discern exactly what side effects she is experiencing but encouraged her to stop the medication and follow-up with her PCP when she established with someone new to determine if she needs to continue taking PPI.  If she has any worsening symptoms she is to return for reevaluation to which she expressed understanding.  Final Clinical Impressions(s) / UC Diagnoses   Final diagnoses:  Seasonal allergies     Discharge Instructions      I have refilled your allergy medication.  Please continue taking this daily.  I also recommend nasal saline and sinus rinses.  Use fluticasone nasal spray as previously prescribed.  Someone should contact you to schedule an appointment with primary care.  If anything worsens or changes and you have increasing congestion, fever, cough, shortness of breath, nausea, vomiting you should be seen immediately.  ??? ??? ?????? ??? ???? ???????? ????? ??.  ???? ????????? ?? ????? ??? ?????.  ????? ????? ???????? ????? ???? ????? ????? ?????? ???????.  ?????? ???? ?????????? ?????? ??? ?? ????? ??????.  ??? ?? ???? ?? ??? ?? ?????? ???? ?? ??????? ???????.  ??? ????? ?? ??? ?? ???? ???? ???? ?????? ???? ????? ???? ?? ?????? ?????? ????? ???? ???? ??? ?????. laqad qumt bi'iieadat mal' dawa' alhasasiat alkhasi bika. yurjaa alaistimrar fi tanawul hadha yawmia. wa'uwsiy aydan biaistikhdam mahlul milhi lil'anf waghasul aljuyub al'anfiati. aistukhdim radhadh flutikasun al'anfii kama hu mwswf sabqan. yajib 'an yatasil bik shakhs ma litahdid maweid mae alrieayat al'awaliati. 'iidhan tafaqum 'ayi shay' 'aw  taghayur wazad ladayk aihtiqan wahumaa wasueal wadiq fi altanafus waghathayan waqay', fayajib fahsuk ealaa  alfur.     ED Prescriptions     Medication Sig Dispense Auth. Provider   cetirizine (ZYRTEC) 10 MG tablet Take 1 tablet (10 mg total) by mouth daily. 90 tablet Daevion Navarette, Noberto Retort, PA-C      PDMP not reviewed this encounter.   Jeani Hawking, PA-C 10/07/22 1343

## 2022-10-07 NOTE — Discharge Instructions (Signed)
I have refilled your allergy medication.  Please continue taking this daily.  I also recommend nasal saline and sinus rinses.  Use fluticasone nasal spray as previously prescribed.  Someone should contact you to schedule an appointment with primary care.  If anything worsens or changes and you have increasing congestion, fever, cough, shortness of breath, nausea, vomiting you should be seen immediately.  ??? ??? ?????? ??? ???? ???????? ????? ??.  ???? ????????? ?? ????? ??? ?????.  ????? ????? ???????? ????? ???? ????? ????? ?????? ???????.  ?????? ???? ?????????? ?????? ??? ?? ????? ??????.  ??? ?? ???? ?? ??? ?? ?????? ???? ?? ??????? ???????.  ??? ????? ?? ??? ?? ???? ???? ???? ?????? ???? ????? ???? ?? ?????? ?????? ????? ???? ???? ??? ?????. laqad qumt bi'iieadat mal' dawa' alhasasiat alkhasi bika. yurjaa alaistimrar fi tanawul hadha yawmia. wa'uwsiy aydan biaistikhdam mahlul milhi lil'anf waghasul aljuyub al'anfiati. aistukhdim radhadh flutikasun al'anfii kama hu mwswf sabqan. yajib 'an yatasil bik shakhs ma litahdid maweid mae alrieayat al'awaliati. 'iidhan tafaqum 'ayi shay' 'aw taghayur wazad ladayk aihtiqan wahumaa wasueal wadiq fi altanafus waghathayan waqay', fayajib fahsuk ealaa alfur.

## 2022-10-17 ENCOUNTER — Other Ambulatory Visit: Payer: Self-pay | Admitting: Nurse Practitioner

## 2022-10-17 DIAGNOSIS — E559 Vitamin D deficiency, unspecified: Secondary | ICD-10-CM

## 2022-11-10 ENCOUNTER — Encounter: Payer: Self-pay | Admitting: Nurse Practitioner

## 2022-11-10 ENCOUNTER — Ambulatory Visit: Payer: Self-pay | Admitting: Nurse Practitioner

## 2022-11-10 DIAGNOSIS — K219 Gastro-esophageal reflux disease without esophagitis: Secondary | ICD-10-CM | POA: Diagnosis not present

## 2022-11-10 DIAGNOSIS — M5441 Lumbago with sciatica, right side: Secondary | ICD-10-CM

## 2022-11-10 DIAGNOSIS — G8929 Other chronic pain: Secondary | ICD-10-CM | POA: Diagnosis not present

## 2022-11-10 MED ORDER — OMEPRAZOLE 20 MG PO CPDR: 20.0000 mg | DELAYED_RELEASE_CAPSULE | Freq: Every day | ORAL | 1 refills | Status: DC

## 2022-11-10 MED ORDER — DICLOFENAC SODIUM 1 % EX GEL: 4.0000 g | Freq: Four times a day (QID) | CUTANEOUS | 3 refills | Status: DC

## 2022-11-10 NOTE — Patient Instructions (Addendum)
[facility] [phone]  Please call  [facility] on [phone] to schedule your colonoscopy   [facility] Aldean Baker [phone]    Call [phone] to schedule the MRI ordered by orthopedics     1. Gastroesophageal reflux disease, unspecified whether esophagitis present  - omeprazole (PRILOSEC) 20 MG capsule; Take 1 capsule (20 mg total) by mouth daily.  Dispense: 90 capsule; Refill: 1  2. Chronic right-sided low back pain with right-sided sciatica  - diclofenac Sodium (VOLTAREN) 1 % GEL; Apply 4 g topically 4 (four) times daily.  Dispense: 50 g; Refill: 3     It is important that you exercise regularly at least 30 minutes 5 times a week as tolerated  Think about what you will eat, plan ahead. Choose " clean, green, fresh or frozen" over canned, processed or packaged foods which are more sugary, salty and fatty. 70 to 75% of food eaten should be vegetables and fruit. Three meals at set times with snacks allowed between meals, but they must be fruit or vegetables. Aim to eat over a 12 hour period , example 7 am to 7 pm, and STOP after  your last meal of the day. Drink water,generally about 64 ounces per day, no other drink is as healthy. Fruit juice is best enjoyed in a healthy way, by EATING the fruit.  Thanks for choosing [facility] we consider it a privelige to serve you.

## 2022-11-10 NOTE — Progress Notes (Signed)
Acute Office Visit  Subjective:     Patient ID: Stacy Conner, female    DOB: [birth year], 52 y.o.   MRN: [id number]  Chief Complaint  Patient presents with   Abdominal Pain    Right side and taste something bitter.   Gastroesophageal Reflux    HPI Stacy Conner  has a past medical history of Allergic conjunctivitis and rhinitis (04/02/2022), Chronic low back pain with right-sided sciatica, Chronic otitis externa of right ear (04/02/2022), GERD (gastroesophageal reflux disease), Intractable migraine with aura with status migrainosus (08/20/2022), Prediabetes (06/23/2022), and Vitamin D deficiency (06/23/2022).  Patient presents with complaints of worsening GERD and chronic low back pain.  Please see assessment and plan section for full HPI.  Patient would like to see her OB/GYN, states that she plans on having another child.  She had seen them about a year ago and would like to see them again.  Phone number to the gynecologist provided  Interpretation services provided via [place] iPad   Review of Systems  Constitutional: Negative.  Negative for activity change, appetite change, diaphoresis and fatigue.  Respiratory: Negative.  Negative for cough, shortness of breath, wheezing and stridor.   Cardiovascular: Negative.  Negative for chest pain, palpitations and leg swelling.  Gastrointestinal:  Positive for abdominal pain and nausea. Negative for abdominal distention, anal bleeding, blood in stool, constipation and diarrhea.  Genitourinary: Negative.  Negative for difficulty urinating, dyspareunia and dysuria.  Musculoskeletal:  Positive for back pain. Negative for gait problem, joint swelling and myalgias.  Skin: Negative.   Psychiatric/Behavioral: Negative.  Negative for confusion, self-injury and suicidal ideas.         Objective:    BP 127/72   Pulse 93   Temp (!) 97.2 F (36.2 C)   Wt 168 lb (76.2 kg)   SpO2 100%   BMI 27.12 kg/m    Physical  Exam Vitals and nursing note reviewed.  Constitutional:      General: She is not in acute distress.    Appearance: Normal appearance. She is not ill-appearing, toxic-appearing or diaphoretic.  HENT:     Mouth/Throat:     Mouth: Mucous membranes are moist.     Pharynx: Oropharynx is clear. No oropharyngeal exudate or posterior oropharyngeal erythema.  Eyes:     General: No scleral icterus.       Right eye: No discharge.        Left eye: No discharge.     Extraocular Movements: Extraocular movements intact.     Conjunctiva/sclera: Conjunctivae normal.  Cardiovascular:     Rate and Rhythm: Normal rate and regular rhythm.     Pulses: Normal pulses.     Heart sounds: Normal heart sounds. No murmur heard.    No friction rub. No gallop.  Pulmonary:     Effort: Pulmonary effort is normal. No respiratory distress.     Breath sounds: Normal breath sounds. No stridor. No wheezing, rhonchi or rales.  Chest:     Chest wall: No tenderness.  Abdominal:     General: There is no distension.     Palpations: Abdomen is soft.     Tenderness: There is abdominal tenderness. There is no right CVA tenderness, left CVA tenderness or guarding.     Comments: Need epigastric tenderness on palpation  Musculoskeletal:        General: Tenderness present. No swelling, deformity or signs of injury.     Right lower leg: No edema.  Left lower leg: No edema.     Comments: Tenderness on palpation of right side of low back area.  Patient able to ambulate without difficulty  Skin:    General: Skin is warm and dry.     Capillary Refill: Capillary refill takes less than 2 seconds.     Coloration: Skin is not jaundiced or pale.     Findings: No bruising, erythema or lesion.  Neurological:     Mental Status: She is alert and oriented to person, place, and time.     Motor: No weakness.     Coordination: Coordination normal.     Gait: Gait normal.  Psychiatric:        Mood and Affect: Mood normal.         Behavior: Behavior normal.        Thought Content: Thought content normal.        Judgment: Judgment normal.     No results found for any visits on 11/10/22.      Assessment & Plan:   Problem List Items Addressed This Visit       Digestive   GERD (gastroesophageal reflux disease)    GERD .patient is in today for c/o worsening acid reflux symptoms.,  Epigastric abdominal pain, bitter taste in her mouth, nausea.  She is on omeprazole 20 mg daily but she ran out of the medication.  No fever, unintentional weight loss hematemesis hematochezia  Omeprazole 20 mg daily refilled, patient referred to GI Also encouraged to call GI to schedule colonoscopy Education on GERD provided      Relevant Medications   omeprazole (PRILOSEC) 20 MG capsule   Other Relevant Orders   Ambulatory referral to Gastroenterology     Other   Low back pain    Chronic she continues to have chronic low back pain.  She was evaluated by orthopedics, they had ordered an MRI but she has not done the MRI.  She has diclofenac gel ordered but she has not been using the medication.  She declined Toradol and Solu-Medrol injection in the office today.  Patient encouraged to get the MRI done and follow-up with orthopedics.  Stretching exercises use of heating pad or ice encouraged.  Voltaren gel 1% 4 g 4 times daily re ordered      Relevant Medications   diclofenac Sodium (VOLTAREN) 1 % GEL    Meds ordered this encounter  Medications   omeprazole (PRILOSEC) 20 MG capsule    Sig: Take 1 capsule (20 mg total) by mouth daily.    Dispense:  90 capsule    Refill:  1   diclofenac Sodium (VOLTAREN) 1 % GEL    Sig: Apply 4 g topically 4 (four) times daily.    Dispense:  50 g    Refill:  3    No follow-ups on file.  Donell Beers, FNP

## 2022-11-10 NOTE — Assessment & Plan Note (Signed)
Chronic she continues to have chronic low back pain.  She was evaluated by orthopedics, they had ordered an MRI but she has not done the MRI.  She has diclofenac gel ordered but she has not been using the medication.  She declined Toradol and Solu-Medrol injection in the office today.  Patient encouraged to get the MRI done and follow-up with orthopedics.  Stretching exercises use of heating pad or ice encouraged.  Voltaren gel 1% 4 g 4 times daily re ordered

## 2022-11-10 NOTE — Assessment & Plan Note (Signed)
GERD .patient is in today for c/o worsening acid reflux symptoms.,  Epigastric abdominal pain, bitter taste in her mouth, nausea.  She is on omeprazole 20 mg daily but she ran out of the medication.  No fever, unintentional weight loss hematemesis hematochezia  Omeprazole 20 mg daily refilled, patient referred to GI Also encouraged to call GI to schedule colonoscopy Education on GERD provided

## 2022-11-19 NOTE — Congregational Nurse Program (Signed)
  Dept: [phone]   Congregational Nurse Program Note  Date of Encounter: 11/19/2022  Past Medical History: Past Medical History:  Diagnosis Date   Allergic conjunctivitis and rhinitis 04/02/2022   Chronic low back pain with right-sided sciatica    Chronic otitis externa of right ear 04/02/2022   GERD (gastroesophageal reflux disease)    Intractable migraine with aura with status migrainosus 08/20/2022   Prediabetes 06/23/2022   Vitamin D deficiency 06/23/2022    Encounter Details:  Patient brought in a prescription from her ophthalmologist. Patient was instructed to take the prescription to a [facility] for prescription glasses. The prescription had reading glasses as well for 2.75 and was given reading glasses to take with her. Patient blood pressure was taken. BP 122/84 and HR 98.   Nicole Cella Jaxen Samples RN BSN PCCN  [facility] [phone]-cell [phone]-office

## 2022-11-22 ENCOUNTER — Ambulatory Visit (HOSPITAL_COMMUNITY)
Admission: EM | Admit: 2022-11-22 | Discharge: 2022-11-22 | Disposition: A | Discharge status: Home / Self Care | Payer: Medicaid Other

## 2022-12-03 ENCOUNTER — Ambulatory Visit (HOSPITAL_COMMUNITY)
Admission: EM | Admit: 2022-12-03 | Discharge: 2022-12-03 | Disposition: A | Discharge status: Home / Self Care | Payer: Medicaid Other | Attending: Physician Assistant | Admitting: Physician Assistant

## 2022-12-03 ENCOUNTER — Encounter (HOSPITAL_COMMUNITY): Payer: Self-pay

## 2022-12-03 DIAGNOSIS — G8929 Other chronic pain: Secondary | ICD-10-CM

## 2022-12-03 DIAGNOSIS — K2950 Unspecified chronic gastritis without bleeding: Secondary | ICD-10-CM | POA: Diagnosis not present

## 2022-12-03 DIAGNOSIS — R1013 Epigastric pain: Secondary | ICD-10-CM | POA: Diagnosis not present

## 2022-12-03 LAB — GLUCOSE, POCT (MANUAL RESULT ENTRY): POC Glucose: 119 mg/dL — AB (ref 70–99)

## 2022-12-03 MED ORDER — ONDANSETRON 4 MG PO TBDP: 4.0000 mg | ORAL_TABLET | Freq: Three times a day (TID) | ORAL | 0 refills | Status: DC | PRN

## 2022-12-03 MED ORDER — SUCRALFATE 1 G PO TABS: 1.0000 g | ORAL_TABLET | Freq: Three times a day (TID) | ORAL | 0 refills | Status: DC

## 2022-12-03 NOTE — ED Triage Notes (Addendum)
Per Interpreter Annabelle Harman [phone]  Patient reports that her"stomach pain continues because she is out of of her medication." (Zofran)  Patient states she has N/v after eating and states her body has a lot of fatigue.".  Patient then said after eating she fills like the food is sitting in her stomach and that makes her fatigued.  Patient states "One of my stomach pills make me SOB."

## 2022-12-03 NOTE — ED Provider Notes (Signed)
[facility]    CSN: [id number] Arrival date & time: 12/03/22  1645      History   Chief Complaint Chief Complaint  Patient presents with   Abdominal Pain    HPI Stacy Conner is a 52 y.o. female.   Complains of abdominal pain.  Patient is requesting a refill of Zofran.  Patient reports this medicine has worked well for her.  Patient was recently given Prilosec and she states this caused her to feel short of breath.  Reports that she has had this pain in the past.  Patient is currently being evaluated and is scheduled for follow-up.  Has been diagnosed with reflux   Abdominal Pain   Past Medical History:  Diagnosis Date   Allergic conjunctivitis and rhinitis 04/02/2022   Chronic low back pain with right-sided sciatica    Chronic otitis externa of right ear 04/02/2022   GERD (gastroesophageal reflux disease)    Intractable migraine with aura with status migrainosus 08/20/2022   Prediabetes 06/23/2022   Vitamin D deficiency 06/23/2022    Patient Active Problem List   Diagnosis Date Noted   Screening for colon cancer 08/20/2022   Intractable migraine with aura with status migrainosus 08/20/2022   Prediabetes 06/23/2022   Vitamin D deficiency 06/23/2022   Allergic rhinitis 06/20/2022   GERD (gastroesophageal reflux disease) 06/20/2022   Screening for endocrine, nutritional, metabolic and immunity disorder 06/20/2022   Chronic otitis externa of right ear 04/02/2022   Health care maintenance 04/02/2022   Allergic conjunctivitis and rhinitis 04/02/2022   Low back pain 07/22/2021    Past Surgical History:  Procedure Laterality Date   NO PAST SURGERIES      OB History     Gravida  3   Para  3   Term  3   Preterm      AB      Living  3      SAB      IAB      Ectopic      Multiple      Live Births               Home Medications    Prior to Admission medications   Medication Sig Start Date End Date Taking?  Authorizing Provider  ondansetron (ZOFRAN-ODT) 4 MG disintegrating tablet Take 1 tablet (4 mg total) by mouth every 8 (eight) hours as needed for nausea or vomiting. 12/03/22  Yes Cheron Schaumann K, PA-C  sucralfate (CARAFATE) 1 g tablet Take 1 tablet (1 g total) by mouth 4 (four) times daily -  with meals and at bedtime. 12/03/22  Yes Elson Areas, PA-C  acetaminophen (TYLENOL) 500 MG tablet Take 1 tablet (500 mg total) by mouth every 6 (six) hours as needed. 08/20/22   Donell Beers, FNP  cetirizine (ZYRTEC) 10 MG tablet Take 1 tablet (10 mg total) by mouth daily. 10/07/22   Raspet, Noberto Retort, PA-C  diclofenac Sodium (VOLTAREN) 1 % GEL Apply 4 g topically 4 (four) times daily. 11/10/22   Paseda, Baird Kay, FNP  fluticasone (FLONASE) 50 MCG/ACT nasal spray Instill 2 puffs in each nostril daily. 09/09/22   [provider]  omeprazole (PRILOSEC) 20 MG capsule Take 1 capsule (20 mg total) by mouth daily. 11/10/22   Donell Beers, FNP  ONDANSETRON HCL PO Take by mouth. Patient not taking: Reported on 08/20/2022    [provider]  Prenat-Fe Poly-Methfol-FA-DHA (VITAFOL ULTRA) 29-0.6-0.4-200 MG CAPS Take 1  tablet by mouth daily. Patient not taking: Reported on 08/20/2022 10/14/21   Constant, Peggy, MD  Vitamin D, Ergocalciferol, (DRISDOL) 1.25 MG (50000 UNIT) CAPS capsule Take 1 capsule (50,000 Units total) by mouth every 7 (seven) days. Patient not taking: Reported on 11/10/2022 08/20/22   Donell Beers, FNP    Family History Family History  Family history unknown: Yes    Social History Social History   Tobacco Use   Smoking status: Never   Smokeless tobacco: Never  Vaping Use   Vaping status: Never Used  Substance Use Topics   Alcohol use: Never   Drug use: Never     Allergies   Penicillins   Review of Systems Review of Systems  Gastrointestinal:  Positive for abdominal pain.  All other systems reviewed and are negative.    Physical Exam Triage Vital  Signs ED Triage Vitals  Encounter Vitals Group     BP 12/03/22 1721 (!) 153/82     Systolic BP Percentile --      Diastolic BP Percentile --      Pulse Rate 12/03/22 1721 (!) 126     Resp 12/03/22 1721 16     Temp 12/03/22 1721 98.3 F (36.8 C)     Temp Source 12/03/22 1721 Oral     SpO2 12/03/22 1721 98 %     Weight --      Height --      Head Circumference --      Peak Flow --      Pain Score 12/03/22 1726 10     Pain Loc --      Pain Education --      Exclude from Growth Chart --    No data found.  Updated Vital Signs BP (!) 154/89   Pulse (!) 106   Temp 98.3 F (36.8 C) (Oral)   Resp 16   LMP 11/01/2022   SpO2 98%   Visual Acuity Right Eye Distance:   Left Eye Distance:   Bilateral Distance:    Right Eye Near:   Left Eye Near:    Bilateral Near:     Physical Exam Vitals reviewed.  Constitutional:      Appearance: She is well-developed.  HENT:     Head: Normocephalic.  Cardiovascular:     Rate and Rhythm: Normal rate.  Pulmonary:     Effort: Pulmonary effort is normal.  Abdominal:     General: Abdomen is flat. Bowel sounds are normal.     Palpations: Abdomen is soft.     Tenderness: There is generalized abdominal tenderness.  Skin:    General: Skin is warm.  Neurological:     General: No focal deficit present.     Mental Status: She is alert.      UC Treatments / Results  Labs (all labs ordered are listed, but only abnormal results are displayed) Labs Reviewed - No data to display  EKG   Radiology No results found.  Procedures Procedures (including critical care time)  Medications Ordered in UC Medications - No data to display  Initial Impression / Assessment and Plan / UC Course  I have reviewed the triage vital signs and the nursing notes.  Pertinent labs & imaging results that were available during my care of the patient were reviewed by me and considered in my medical decision making (see chart for details).     Patient  given a prescription for Zofran.  I will try the patient on Carafate.  Patient's blood pressure is slightly elevated she is very concerned about this I advised her she is slightly elevated and she needs to schedule follow-up with her primary care physician for recheck. Final Clinical Impressions(s) / UC Diagnoses   Final diagnoses:  Abdominal pain, chronic, epigastric  Chronic gastritis without bleeding, unspecified gastritis type     Discharge Instructions      Return if any problems.     ED Prescriptions     Medication Sig Dispense Auth. Provider   ondansetron (ZOFRAN-ODT) 4 MG disintegrating tablet Take 1 tablet (4 mg total) by mouth every 8 (eight) hours as needed for nausea or vomiting. 20 tablet Shadasia Oldfield K, PA-C   sucralfate (CARAFATE) 1 g tablet Take 1 tablet (1 g total) by mouth 4 (four) times daily -  with meals and at bedtime. 60 tablet Elson Areas, [place]      PDMP not reviewed this encounter. An After Visit Summary was printed and given to the patient.       Elson Areas, [place] 12/03/22 1927

## 2022-12-03 NOTE — Discharge Instructions (Addendum)
Return if any problems.

## 2022-12-03 NOTE — Congregational Nurse Program (Signed)
  Dept: [phone]   Congregational Nurse Program Note  Date of Encounter: 12/03/2022  Past Medical History: Past Medical History:  Diagnosis Date   Allergic conjunctivitis and rhinitis 04/02/2022   Chronic low back pain with right-sided sciatica    Chronic otitis externa of right ear 04/02/2022   GERD (gastroesophageal reflux disease)    Intractable migraine with aura with status migrainosus 08/20/2022   Prediabetes 06/23/2022   Vitamin D deficiency 06/23/2022    Encounter Details:  Community Questionnaire - 12/03/22 1207       Questionnaire   Ask client: Do you give verbal consent for me to treat you today? 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    Location Patient Served  [facility]    Encounter Setting CN site    Population Status Unknown    Insurance Medicaid    Insurance/Financial Assistance Referral Medicaid    Medication Have Medication Insecurities    Medical Provider Yes    Screening Referrals Made N/A    Medical Referrals Made N/A    Medical Appointment Completed N/A    CNP Interventions Advocate/Support;Navigate Healthcare System;Case Management;Counsel;Educate;Spiritual Care    Screenings CN Performed N/A    ED Visit Averted Yes    Life-Saving Intervention Made N/A      Questionnaire   Housing/Utilities N/A            Patient presents for routine follow up. Blood pressure checked and was elevated at 152/91. Patient educated on reducing her salt intake and prioritizing exercise to manage blood pressure. Patient reports a headache and nasal congestion. Patient educated on using her nasal spray daily to assist with symptoms.  Blood glucose checked and was 119 following eating breakfast.  Arman Bogus RN BSN PCCN  [facility] [phone]-cell [phone]-office

## 2022-12-05 ENCOUNTER — Encounter: Payer: Self-pay | Admitting: Nurse Practitioner

## 2022-12-05 ENCOUNTER — Ambulatory Visit: Payer: Medicaid Other | Admitting: Nurse Practitioner

## 2022-12-05 VITALS — BP 136/75 | HR 103 | Ht 66.0 in | Wt 164.0 lb

## 2022-12-05 DIAGNOSIS — Z09 Encounter for follow-up examination after completed treatment for conditions other than malignant neoplasm: Secondary | ICD-10-CM | POA: Insufficient documentation

## 2022-12-05 DIAGNOSIS — J309 Allergic rhinitis, unspecified: Secondary | ICD-10-CM | POA: Diagnosis not present

## 2022-12-05 DIAGNOSIS — R030 Elevated blood-pressure reading, without diagnosis of hypertension: Secondary | ICD-10-CM | POA: Insufficient documentation

## 2022-12-05 DIAGNOSIS — K219 Gastro-esophageal reflux disease without esophagitis: Secondary | ICD-10-CM

## 2022-12-05 MED ORDER — CETIRIZINE HCL 10 MG PO TABS
10.0000 mg | ORAL_TABLET | Freq: Every day | ORAL | 1 refills | Status: DC
Start: 2022-12-05 — End: 2023-05-20

## 2022-12-05 NOTE — Assessment & Plan Note (Signed)
Patient encouraged to follow up with GI, phone number provided Encouraged to take Carafate 1 g 3 times daily with meals and at bedtime as ordered

## 2022-12-05 NOTE — Assessment & Plan Note (Signed)
Hospital chart reviewed, including discharge summary Medications reconciled and reviewed with the patient in detail 

## 2022-12-05 NOTE — Assessment & Plan Note (Addendum)
I encouraged the patient to take cetirizine 10 mg daily with Flonase nasal spray 2 spray into both nostrils daily. Will discuss referral to allergy at next appointment if needed

## 2022-12-05 NOTE — Assessment & Plan Note (Signed)
BP Readings from Last 3 Encounters:  12/05/22 136/75  12/03/22 (!) 154/89  12/03/22 (!) 151/92  Patient counseled on DASH diet, need for adequate rest and regular moderate exercises at least [place] 50 minutes weekly discussed.

## 2022-12-05 NOTE — Progress Notes (Signed)
Established Patient Office Visit  Subjective:  Patient ID: Stacy Conner, female    DOB: [birth year]  Age: 51 y.o. MRN: [id number]  CC:  Chief Complaint  Patient presents with   Medical Management of Chronic Issues    Dizziness, headaches and body pain for 1 week     HPI Stacy Conner is a 52 y.o. female  has a past medical history of Allergic conjunctivitis and rhinitis (04/02/2022), Chronic low back pain with right-sided sciatica, Chronic otitis externa of right ear (04/02/2022), GERD (gastroesophageal reflux disease), Intractable migraine with aura with status migrainosus (08/20/2022), Prediabetes (06/23/2022), and Vitamin D deficiency (06/23/2022).   Patient presents with complaints of headache, sneezing, stuffiness, runny nose, itchy eyes for a week.  She denies cough, shortness of breath, wheezing, body aches.  She has been using Flonase nasal spray daily.  Patient was at urgent care on 12/03/2022 for abdominal pain.  Stated that prior visit made her feel short of breath.  She was prescribed Carafate 1 g tablets which she has picked up from the pharmacy but has not started using.  Patient was referred to GI at her last appointment they tried calling her to schedule an appointment but they were unable to reach her.  Interpretation services provided via [facility]    Past Medical History:  Diagnosis Date   Allergic conjunctivitis and rhinitis 04/02/2022   Chronic low back pain with right-sided sciatica    Chronic otitis externa of right ear 04/02/2022   GERD (gastroesophageal reflux disease)    Intractable migraine with aura with status migrainosus 08/20/2022   Prediabetes 06/23/2022   Vitamin D deficiency 06/23/2022    Past Surgical History:  Procedure Laterality Date   NO PAST SURGERIES      Family History  Family history unknown: Yes    Social History   Socioeconomic History   Marital status: Married    Spouse name: Not on file    Number of children: 3   Years of education: Not on file   Highest education level: Not on file  Occupational History   Not on file  Tobacco Use   Smoking status: Never   Smokeless tobacco: Never  Vaping Use   Vaping status: Never Used  Substance and Sexual Activity   Alcohol use: Never   Drug use: Never   Sexual activity: Yes    Birth control/protection: None  Other Topics Concern   Not on file  Social History Narrative   Not on file   Social Determinants of Health   Financial Resource Strain: Not on file  Food Insecurity: Low Risk  (09/09/2022)   Received from [facility]   Hunger Vital Sign    Worried About Running Out of Food in the Last Year: Never true    Ran Out of Food in the Last Year: Never true  Transportation Needs: Not on file (09/09/2022)  Physical Activity: Not on file  Stress: Not on file  Social Connections: Not on file  Intimate Partner Violence: Not on file    Outpatient Medications Prior to Visit  Medication Sig Dispense Refill   fluticasone (FLONASE) 50 MCG/ACT nasal spray Instill 2 puffs in each nostril daily.     ondansetron (ZOFRAN-ODT) 4 MG disintegrating tablet Take 1 tablet (4 mg total) by mouth every 8 (eight) hours as needed for nausea or vomiting. 20 tablet 0   sucralfate (CARAFATE) 1 g tablet Take 1 tablet (1 g total) by mouth 4 (four) times  daily -  with meals and at bedtime. 60 tablet 0   cetirizine (ZYRTEC) 10 MG tablet Take 1 tablet (10 mg total) by mouth daily. 90 tablet 0   acetaminophen (TYLENOL) 500 MG tablet Take 1 tablet (500 mg total) by mouth every 6 (six) hours as needed. (Patient not taking: Reported on 12/05/2022) 30 tablet 0   diclofenac Sodium (VOLTAREN) 1 % GEL Apply 4 g topically 4 (four) times daily. (Patient not taking: Reported on 12/05/2022) 50 g 3   omeprazole (PRILOSEC) 20 MG capsule Take 1 capsule (20 mg total) by mouth daily. (Patient not taking: Reported on 12/05/2022) 90 capsule 1   ONDANSETRON HCL PO Take by  mouth. (Patient not taking: Reported on 08/20/2022)     Prenat-Fe Poly-Methfol-FA-DHA (VITAFOL ULTRA) 29-0.6-0.4-200 MG CAPS Take 1 tablet by mouth daily. (Patient not taking: Reported on 08/20/2022) 30 capsule 12   Vitamin D, Ergocalciferol, (DRISDOL) 1.25 MG (50000 UNIT) CAPS capsule Take 1 capsule (50,000 Units total) by mouth every 7 (seven) days. (Patient not taking: Reported on 11/10/2022) 8 capsule 0   No facility-administered medications prior to visit.    Allergies  Allergen Reactions   Penicillins     ROS Review of Systems  Constitutional:  Negative for appetite change, chills, fatigue and fever.  HENT:  Positive for congestion, rhinorrhea, sinus pressure and sneezing. Negative for postnasal drip.   Eyes:  Positive for itching. Negative for pain and discharge.  Respiratory:  Negative for cough, shortness of breath and wheezing.   Cardiovascular:  Negative for chest pain, palpitations and leg swelling.  Gastrointestinal:  Negative for abdominal pain, constipation, nausea and vomiting.  Genitourinary:  Negative for difficulty urinating, dysuria, flank pain and frequency.  Skin:  Negative for color change, pallor, rash and wound.  Neurological:  Positive for dizziness and headaches. Negative for facial asymmetry, weakness and numbness.  Psychiatric/Behavioral:  Negative for behavioral problems, confusion, self-injury and suicidal ideas.       Objective:    Physical Exam Vitals and nursing note reviewed.  Constitutional:      General: She is not in acute distress.    Appearance: Normal appearance. She is not ill-appearing, toxic-appearing or diaphoretic.  HENT:     Nose: Congestion present. No rhinorrhea.     Mouth/Throat:     Mouth: Mucous membranes are moist.     Pharynx: Oropharynx is clear. No oropharyngeal exudate or posterior oropharyngeal erythema.  Eyes:     General: No scleral icterus.       Right eye: No discharge.        Left eye: No discharge.     Extraocular  Movements: Extraocular movements intact.     Conjunctiva/sclera: Conjunctivae normal.  Cardiovascular:     Rate and Rhythm: Normal rate and regular rhythm.     Pulses: Normal pulses.     Heart sounds: Normal heart sounds. No murmur heard.    No friction rub. No gallop.  Pulmonary:     Effort: Pulmonary effort is normal. No respiratory distress.     Breath sounds: Normal breath sounds. No stridor. No wheezing, rhonchi or rales.  Chest:     Chest wall: No tenderness.  Abdominal:     General: There is no distension.     Palpations: Abdomen is soft.     Tenderness: There is no abdominal tenderness. There is no right CVA tenderness, left CVA tenderness or guarding.  Musculoskeletal:        General: No swelling, tenderness, deformity or  signs of injury.     Right lower leg: No edema.     Left lower leg: No edema.  Skin:    General: Skin is warm and dry.     Capillary Refill: Capillary refill takes less than 2 seconds.     Coloration: Skin is not jaundiced or pale.     Findings: No bruising, erythema or lesion.  Neurological:     Mental Status: She is alert and oriented to person, place, and time.     Motor: No weakness.     Coordination: Coordination normal.     Gait: Gait normal.  Psychiatric:        Mood and Affect: Mood normal.        Behavior: Behavior normal.        Thought Content: Thought content normal.        Judgment: Judgment normal.     BP 136/75   Pulse (!) 103   Ht 5\' 6"  (1.676 m)   Wt 164 lb (74.4 kg)   LMP 11/01/2022   SpO2 100%   BMI 26.47 kg/m  Wt Readings from Last 3 Encounters:  12/05/22 164 lb (74.4 kg)  11/10/22 168 lb (76.2 kg)  08/20/22 169 lb 9.6 oz (76.9 kg)    Lab Results  Component Value Date   TSH 0.985 06/20/2022   Lab Results  Component Value Date   WBC 6.1 06/20/2022   HGB 13.9 06/20/2022   HCT 41.5 06/20/2022   MCV 91 06/20/2022   PLT 288 06/20/2022   Lab Results  Component Value Date   NA 140 06/20/2022   K 4.0 06/20/2022    CO2 23 06/20/2022   GLUCOSE 91 06/20/2022   BUN 9 06/20/2022   CREATININE 0.59 06/20/2022   BILITOT 0.2 06/20/2022   ALKPHOS 69 06/20/2022   AST 15 06/20/2022   ALT 12 06/20/2022   PROT 6.8 06/20/2022   ALBUMIN 3.8 (L) 06/20/2022   CALCIUM 9.2 06/20/2022   EGFR 110 06/20/2022   Lab Results  Component Value Date   CHOL 190 11/30/2020   Lab Results  Component Value Date   HDL 58 11/30/2020   Lab Results  Component Value Date   LDLCALC 121 (H) 11/30/2020   Lab Results  Component Value Date   TRIG 56 11/30/2020   Lab Results  Component Value Date   CHOLHDL 3.3 11/30/2020   Lab Results  Component Value Date   HGBA1C 5.8 (H) 06/20/2022      Assessment & Plan:   Problem List Items Addressed This Visit       Respiratory   Allergic rhinitis    I encouraged the patient to take cetirizine 10 mg daily with Flonase nasal spray 2 spray into both nostrils daily. Will discuss referral to allergy at next appointment if needed       Relevant Medications   cetirizine (ZYRTEC) 10 MG tablet     Digestive   GERD (gastroesophageal reflux disease) - Primary    Patient encouraged to follow up with GI, phone number provided Encouraged to take Carafate 1 g 3 times daily with meals and at bedtime as ordered         Other   Elevated BP without diagnosis of hypertension    BP Readings from Last 3 Encounters:  12/05/22 136/75  12/03/22 (!) 154/89  12/03/22 (!) 151/92  Patient counseled on DASH diet, need for adequate rest and regular moderate exercises at least [place] 50 minutes weekly discussed.  Encounter for examination following treatment at hospital    Hospital chart reviewed, including discharge summary Medications reconciled and reviewed with the patient in detail        Meds ordered this encounter  Medications   cetirizine (ZYRTEC) 10 MG tablet    Sig: Take 1 tablet (10 mg total) by mouth daily.    Dispense:  90 tablet    Refill:  1    Follow-up:  No follow-ups on file.    Donell Beers, FNP

## 2022-12-05 NOTE — Patient Instructions (Signed)
Please call about GI on [phone] to schedule an appointment for your stomach   I encourage you to use Flonase nasal spray 2 spray into both nostrils daily, take Zyrtec 10 mg daily for your allergies.   It is important that you exercise regularly at least 30 minutes 5 times a week as tolerated  Think about what you will eat, plan ahead. Choose " clean, green, fresh or frozen" over canned, processed or packaged foods which are more sugary, salty and fatty. 70 to 75% of food eaten should be vegetables and fruit. Three meals at set times with snacks allowed between meals, but they must be fruit or vegetables. Aim to eat over a 12 hour period , example 7 am to 7 pm, and STOP after  your last meal of the day. Drink water,generally about 64 ounces per day, no other drink is as healthy. Fruit juice is best enjoyed in a healthy way, by EATING the fruit.  Thanks for choosing [facility] we consider it a privelige to serve you.

## 2022-12-22 ENCOUNTER — Ambulatory Visit (INDEPENDENT_AMBULATORY_CARE_PROVIDER_SITE_OTHER): Payer: Medicaid Other | Admitting: Nurse Practitioner

## 2022-12-22 ENCOUNTER — Encounter: Payer: Self-pay | Admitting: Nurse Practitioner

## 2022-12-22 VITALS — BP 131/79 | HR 93 | Temp 98.1°F | Ht 66.0 in | Wt 164.0 lb

## 2022-12-22 DIAGNOSIS — M5441 Lumbago with sciatica, right side: Secondary | ICD-10-CM | POA: Diagnosis not present

## 2022-12-22 DIAGNOSIS — G8929 Other chronic pain: Secondary | ICD-10-CM

## 2022-12-22 DIAGNOSIS — K219 Gastro-esophageal reflux disease without esophagitis: Secondary | ICD-10-CM

## 2022-12-22 MED ORDER — PANTOPRAZOLE SODIUM 40 MG PO TBEC
40.0000 mg | DELAYED_RELEASE_TABLET | Freq: Every day | ORAL | 3 refills | Status: DC
Start: 2022-12-22 — End: 2023-07-02

## 2022-12-22 NOTE — Progress Notes (Signed)
Subjective   Patient ID: Stacy Conner, female    DOB: 03/31/1970, 52 y.o.   MRN: [id number]  Chief Complaint  Patient presents with   Back Pain    Referring provider: Donell Beers, FNP  Stacy Conner Stacy Conner is a 52 y.o. female with Past Medical History: 04/02/2022: Allergic conjunctivitis and rhinitis No date: Chronic low back pain with right-sided sciatica 04/02/2022: Chronic otitis externa of right ear No date: GERD (gastroesophageal reflux disease) 08/20/2022: Intractable migraine with aura with status migrainosus 06/23/2022: Prediabetes 06/23/2022: Vitamin D deficiency   HPI  Patient presents today for an acute visit.  Interpreter was used for this visit.  Patient states that she has been having severe back pain for the past 2 years.  She did have an appointment with Ortho in May but has failed to follow-up due to language barrier.  She states that she has been told in the past that she needs surgery.  She states that the pain is worsening.  She states that is low back pain which radiates down her right leg.  We assisted her with making a new appointment while in office today.  Patient is scheduled to follow-up with Ortho tomorrow.  Patient also complains of ongoing reflux with vomiting.  She has not been taking omeprazole due to it making her short of breath.  We will trial Protonix.  We will place a referral to GI.    Denies f/c/s, hemoptysis, PND, leg swelling Denies chest pain or edema       Allergies  Allergen Reactions   Penicillins      There is no immunization history on file for this patient.  Tobacco History: Social History   Tobacco Use  Smoking Status Never  Smokeless Tobacco Never   Counseling given: Not Answered   Outpatient Encounter Medications as of 12/22/2022  Medication Sig   acetaminophen (TYLENOL) 500 MG tablet Take 1 tablet (500 mg total) by mouth every 6 (six) hours as needed.   cetirizine (ZYRTEC) 10 MG  tablet Take 1 tablet (10 mg total) by mouth daily.   diclofenac Sodium (VOLTAREN) 1 % GEL Apply 4 g topically 4 (four) times daily.   fluticasone (FLONASE) 50 MCG/ACT nasal spray Instill 2 puffs in each nostril daily.   omeprazole (PRILOSEC) 20 MG capsule Take 1 capsule (20 mg total) by mouth daily.   ondansetron (ZOFRAN-ODT) 4 MG disintegrating tablet Take 1 tablet (4 mg total) by mouth every 8 (eight) hours as needed for nausea or vomiting.   ONDANSETRON HCL PO Take by mouth.   pantoprazole (PROTONIX) 40 MG tablet Take 1 tablet (40 mg total) by mouth daily.   Prenat-Fe Poly-Methfol-FA-DHA (VITAFOL ULTRA) 29-0.6-0.4-200 MG CAPS Take 1 tablet by mouth daily.   sucralfate (CARAFATE) 1 g tablet Take 1 tablet (1 g total) by mouth 4 (four) times daily -  with meals and at bedtime.   Vitamin D, Ergocalciferol, (DRISDOL) 1.25 MG (50000 UNIT) CAPS capsule Take 1 capsule (50,000 Units total) by mouth every 7 (seven) days.   No facility-administered encounter medications on file as of 12/22/2022.    Review of Systems  Review of Systems  HENT: Negative.       Objective:   BP 131/79 (BP Location: Left Arm, Patient Position: Sitting, Cuff Size: Large)   Pulse 93   Temp 98.1 F (36.7 C) (Oral)   Ht 5\' 6"  (1.676 m)   Wt 164 lb (74.4 kg)   LMP 11/01/2022   SpO2  99%   BMI 26.47 kg/m   Wt Readings from Last 5 Encounters:  12/22/22 164 lb (74.4 kg)  12/05/22 164 lb (74.4 kg)  11/10/22 168 lb (76.2 kg)  08/20/22 169 lb 9.6 oz (76.9 kg)  06/20/22 164 lb 3.2 oz (74.5 kg)     Physical Exam Vitals and nursing note reviewed.  Constitutional:      General: She is not in acute distress.    Appearance: She is well-developed.  Cardiovascular:     Rate and Rhythm: Regular rhythm.  Pulmonary:     Effort: Pulmonary effort is normal.     Breath sounds: Normal breath sounds.  Musculoskeletal:     Lumbar back: Tenderness present. Decreased range of motion.  Skin:    Findings: No rash.   Neurological:     Mental Status: She is alert and oriented to person, place, and time.       Assessment & Plan:   Chronic right-sided low back pain with right-sided sciatica  Gastroesophageal reflux disease without esophagitis -     Pantoprazole Sodium; Take 1 tablet (40 mg total) by mouth daily.  Dispense: 30 tablet; Refill: 3 -     Ambulatory referral to Gastroenterology   Patient Instructions  1. Chronic right-sided low back pain with right-sided sciatica  Appointment made with ortho for tomorrow  2. Gastroesophageal reflux disease without esophagitis  - pantoprazole (PROTONIX) 40 MG tablet; Take 1 tablet (40 mg total) by mouth daily.  Dispense: 30 tablet; Refill: 3 - Ambulatory referral to Gastroenterology  Follow up:  Follow up PRN    No follow-ups on file.   Ivonne Andrew, NP 12/22/2022

## 2022-12-22 NOTE — Patient Instructions (Signed)
1. Chronic right-sided low back pain with right-sided sciatica  Appointment made with ortho for tomorrow  2. Gastroesophageal reflux disease without esophagitis  - pantoprazole (PROTONIX) 40 MG tablet; Take 1 tablet (40 mg total) by mouth daily.  Dispense: 30 tablet; Refill: 3 - Ambulatory referral to Gastroenterology  Follow up:  Follow up PRN

## 2022-12-23 ENCOUNTER — Ambulatory Visit (INDEPENDENT_AMBULATORY_CARE_PROVIDER_SITE_OTHER): Payer: Medicaid Other | Admitting: Physician Assistant

## 2022-12-23 ENCOUNTER — Encounter: Payer: Self-pay | Admitting: Physician Assistant

## 2022-12-23 DIAGNOSIS — M5440 Lumbago with sciatica, unspecified side: Secondary | ICD-10-CM

## 2022-12-23 DIAGNOSIS — M545 Low back pain, unspecified: Secondary | ICD-10-CM

## 2022-12-23 NOTE — Progress Notes (Signed)
Office Visit Note   Patient: Stacy Conner           Date of Birth: 1970-08-26           MRN: [id number] Visit Date: 12/23/2022              Requested by: Donell Beers, FNP [address] [place],,  [place] [address] PCP: Donell Beers, FNP  No chief complaint on file.     HPI: Patient is a pleasant 52 year old woman who have seen in the past for acute lower back pain with radicular findings down her right lower extremity.  This is now been going on for 4 years and she did say she had an MRI when she was living in [place] but from her description sounds like she had a herniated disc.  [place] this [place].  She continues to have low back pain with right sided radicular findings.  She has failed conservative treatment and cannot take anti-inflammatories or steroids because of some GI issues.  She has been trying to work with her primary care provider to get an MRI and referral to a Careers adviser.  I have seen her previously and an MRI was ordered I am unclear as to why she did not go forward with this suspect it was a language barrier issue.  She denies any new symptoms but has continued symptoms in her low back to her right lower leg.  Rates her pain is moderate to severe no loss of bowel or bladder control  Assessment & Plan: Visit Diagnoses: Acute low back pain with sciatica  Plan: We will refer her to an MRI as well as a follow-up with Dr. Christell Constant afterwards.  She is also requesting if we know a new referral to a different primary care provider per her request.  Follow-Up Instructions: With Dr. Christell Constant after MRI  Ortho Exam  Patient is alert, oriented, no adenopathy, well-dressed, normal affect, normal respiratory effort. Patient appears uncomfortable.  She has a positive straight leg raise on the right pain goes from her right buttock down to the lateral side of her right foot.  Described as burning and stabbing and sharp.  She does have good strength.   With dorsiflexion plantarflexion extension and flexion of her legs.  Imaging: No results found. No images are attached to the encounter.  Labs: Lab Results  Component Value Date   HGBA1C 5.8 (H) 06/20/2022   HGBA1C 5.5 12/21/2020     Lab Results  Component Value Date   ALBUMIN 3.8 (L) 06/20/2022   ALBUMIN 4.3 11/30/2020    No results found for: "MG" Lab Results  Component Value Date   VD25OH 14.3 (L) 06/20/2022   VD25OH 13.3 (L) 11/30/2020    No results found for: "PREALBUMIN"    Latest Ref Rng & Units 06/20/2022   11:55 AM 06/10/2021    9:50 AM 11/30/2020    2:55 PM  CBC EXTENDED  WBC 3.4 - 10.8 x10E3/uL 6.1  6.0  6.3   RBC 3.77 - 5.28 x10E6/uL 4.55  4.34  4.46   Hemoglobin 11.1 - 15.9 g/dL 56.3  87.5  64.3   HCT 34.0 - 46.6 % 41.5  39.6  39.1   Platelets 150 - 450 x10E3/uL 288  277  285   NEUT# 1.4 - 7.0 x10E3/uL 2.2   2.3   Lymph# 0.7 - 3.1 x10E3/uL 2.7   2.5      There is no height  or weight on file to calculate BMI.  Orders:  No orders of the defined types were placed in this encounter.  No orders of the defined types were placed in this encounter.    Procedures: No procedures performed  Clinical Data: No additional findings.  ROS:  All other systems negative, except as noted in the HPI. Review of Systems  Objective: Vital Signs: LMP 11/01/2022   Specialty Comments:  No specialty comments available.  PMFS History: Patient Active Problem List   Diagnosis Date Noted   Elevated BP without diagnosis of hypertension 12/05/2022   Encounter for examination following treatment at hospital 12/05/2022   Screening for colon cancer 08/20/2022   Intractable migraine with aura with status migrainosus 08/20/2022   Prediabetes 06/23/2022   Vitamin D deficiency 06/23/2022   Allergic rhinitis 06/20/2022   GERD (gastroesophageal reflux disease) 06/20/2022   Screening for endocrine, nutritional, metabolic and immunity disorder 06/20/2022   Chronic otitis  externa of right ear 04/02/2022   Health care maintenance 04/02/2022   Allergic conjunctivitis and rhinitis 04/02/2022   Low back pain 07/22/2021   Past Medical History:  Diagnosis Date   Allergic conjunctivitis and rhinitis 04/02/2022   Chronic low back pain with right-sided sciatica    Chronic otitis externa of right ear 04/02/2022   GERD (gastroesophageal reflux disease)    Intractable migraine with aura with status migrainosus 08/20/2022   Prediabetes 06/23/2022   Vitamin D deficiency 06/23/2022    Family History  Family history unknown: Yes    Past Surgical History:  Procedure Laterality Date   NO PAST SURGERIES     Social History   Occupational History   Not on file  Tobacco Use   Smoking status: Never   Smokeless tobacco: Never  Vaping Use   Vaping status: Never Used  Substance and Sexual Activity   Alcohol use: Never   Drug use: Never   Sexual activity: Yes    Birth control/protection: None

## 2022-12-30 ENCOUNTER — Ambulatory Visit (HOSPITAL_BASED_OUTPATIENT_CLINIC_OR_DEPARTMENT_OTHER): Payer: Medicaid Other | Admitting: Family Medicine

## 2022-12-30 NOTE — Congregational Nurse Program (Signed)
  Dept: [phone]   Congregational Nurse Program Note  Date of Encounter: 12/30/2022  Past Medical History: Past Medical History:  Diagnosis Date   Allergic conjunctivitis and rhinitis 04/02/2022   Chronic low back pain with right-sided sciatica    Chronic otitis externa of right ear 04/02/2022   GERD (gastroesophageal reflux disease)    Intractable migraine with aura with status migrainosus 08/20/2022   Prediabetes 06/23/2022   Vitamin D deficiency 06/23/2022    Encounter Details:  Community Questionnaire - 12/30/22 1150       Questionnaire   Ask client: Do you give verbal consent for me to treat you today? 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    Location Patient Served  [facility]    Encounter Setting CN site    Population Status Unknown    Insurance Medicaid    Insurance/Financial Assistance Referral Medicaid    Medication Have Medication Insecurities    Medical Provider Yes    Screening Referrals Made N/A    Medical Referrals Made N/A    Medical Appointment Completed N/A    CNP Interventions Advocate/Support;Navigate Healthcare System;Case Management;Counsel;Educate;Spiritual Care    Screenings CN Performed Blood Pressure    ED Visit Averted Yes    Life-Saving Intervention Made N/A            Pt came into congregational RN clinic requesting assistance with prescription eye glasses. Pt was provided with the address of the [facility] that accepts Medicaid. BP was taken on pt, which was reported as 124/80.   Nicole Cella Maleeka Sabatino RN BSN PCCN  [facility] [phone]-cell [phone]-office

## 2023-01-12 ENCOUNTER — Ambulatory Visit (HOSPITAL_BASED_OUTPATIENT_CLINIC_OR_DEPARTMENT_OTHER): Payer: Medicaid Other | Admitting: Family Medicine

## 2023-01-21 ENCOUNTER — Encounter: Payer: Medicaid Other | Admitting: Orthopedic Surgery

## 2023-01-21 ENCOUNTER — Telehealth (HOSPITAL_BASED_OUTPATIENT_CLINIC_OR_DEPARTMENT_OTHER): Payer: Self-pay | Admitting: Nurse Practitioner

## 2023-01-21 NOTE — Telephone Encounter (Signed)
PT came in for an appointment but was at the wrong office. The patient should have been at [facility] on [address].  The patient was having a hard time understanding, so I offered to assist her.  I called [facility] no answer - was able to talk with Kathryne Gin via Teams.  The MRI has not been completed, the patient will need the MRI before she has her appt with the provider at [facility].   I seen the Auth for [facility] [address] - The auth is 12-24-22 thru 01-23-23, I called [facility], and they can't schedule her before the Auth End date.  I advised the patient not to call or go to [facility] ---as she would be getting a call from that office. Verified that the phone is correct on the file.  Please talk slow for the patient so she understands what you are saying.   [facility] ---Thank you for helping!

## 2023-01-21 NOTE — Telephone Encounter (Signed)
Pt can still get scheduled at [facility] imaging and we can get the authorization resubmitted after the exp date, to prevent a denial due to duplication.

## 2023-01-26 NOTE — Telephone Encounter (Signed)
Pt aware of extension

## 2023-01-26 NOTE — Telephone Encounter (Signed)
Current Status: Approved Validity Period: 01/26/2023 - 02/25/2023 Authorization: [id number] (Lumbar Spine MRI)

## 2023-02-26 ENCOUNTER — Encounter (HOSPITAL_BASED_OUTPATIENT_CLINIC_OR_DEPARTMENT_OTHER): Payer: Self-pay | Admitting: Family Medicine

## 2023-02-26 ENCOUNTER — Ambulatory Visit (HOSPITAL_BASED_OUTPATIENT_CLINIC_OR_DEPARTMENT_OTHER): Payer: Medicaid Other | Admitting: Family Medicine

## 2023-02-26 DIAGNOSIS — K219 Gastro-esophageal reflux disease without esophagitis: Secondary | ICD-10-CM

## 2023-02-26 DIAGNOSIS — Z1211 Encounter for screening for malignant neoplasm of colon: Secondary | ICD-10-CM

## 2023-02-26 DIAGNOSIS — M545 Low back pain, unspecified: Secondary | ICD-10-CM

## 2023-02-26 DIAGNOSIS — M25511 Pain in right shoulder: Secondary | ICD-10-CM | POA: Diagnosis not present

## 2023-02-26 DIAGNOSIS — M25512 Pain in left shoulder: Secondary | ICD-10-CM

## 2023-02-26 DIAGNOSIS — M25519 Pain in unspecified shoulder: Secondary | ICD-10-CM | POA: Insufficient documentation

## 2023-02-26 DIAGNOSIS — E559 Vitamin D deficiency, unspecified: Secondary | ICD-10-CM

## 2023-02-26 DIAGNOSIS — Z0000 Encounter for general adult medical examination without abnormal findings: Secondary | ICD-10-CM

## 2023-02-26 NOTE — Patient Instructions (Signed)
  Medication Instructions:  Your physician recommends that you continue on your current medications as directed. Please refer to the Current Medication list given to you today. --If you need a refill on any your medications before your next appointment, please call your pharmacy first. If no refills are authorized on file call the office.-- Lab Work: Your physician has recommended that you have lab work today: 1 week before next visit  If you have labs (blood work) drawn today and your tests are completely normal, you will receive your results via MyChart message OR a phone call from our staff.  Please ensure you check your voicemail in the event that you authorized detailed messages to be left on a delegated number. If you have any lab test that is abnormal or we need to change your treatment, we will call you to review the results.  Referrals/Procedures/Imaging: Gastroenterology   Follow-Up: Your next appointment:   Your physician recommends that you schedule a follow-up appointment in: 3-4 months physical  with Dr. de [place]  You will receive a text message or e-mail with a link to a survey about your care and experience with us  today! We would greatly appreciate your feedback!   Thanks for letting us  be apart of your health journey!!  Primary Care and Sports Medicine   Dr. Quintin sheerer Cuba   We encourage you to activate your patient portal called MyChart.  Sign up information is provided on this After Visit Summary.  MyChart is used to connect with patients for Virtual Visits (Telemedicine).  Patients are able to view lab/test results, encounter notes, upcoming appointments, etc.  Non-urgent messages can be sent to your provider as well. To learn more about what you can do with MyChart, please visit --  [email or web address].

## 2023-02-26 NOTE — Progress Notes (Signed)
 New Patient Office Visit  Subjective   Patient ID: Stacy Conner, female    DOB: 04-Jun-1970  Age: 53 y.o. MRN: [id number]  CC:  Chief Complaint  Patient presents with   New Patient (Initial Visit)    New Patient pt has been having pain in both shoulders to the point she can't sleep at night. This has been going on for 3 days     HPI [name] presents to establish care Last PCP - [facility] Patient with some initial confusion about needing new primary and that we were a primary care office that she is establishing with  MSK complaints including pain in shoulders and in low back. Has been seeing ortho related to low back pain. Plan was for MRI, however not completed yet, unclear why not. Shoulders have been painful for about 3 days. Has had some shoulder pain in the past. Worse with activity. Thinks cold weather is also affecting symptoms now. No interventions tried thus far.  She is also reporting abdominal pain, does have history of GERD, has been referred to GI, no appointment scheduled. She has been prescribed PPI in the past but has never picked up medication or taken medication.  History of low vitamin D , not taking prescribed supplement.  Patient is originally from [place], has been living here for about 2 years. Patient is in school for learning English, wants to be nurse. She enjoys cleaning, spending time with family.  Outpatient Encounter Medications as of 02/26/2023  Medication Sig   diclofenac  Sodium (VOLTAREN ) 1 % GEL Apply 4 g topically 4 (four) times daily.   acetaminophen  (TYLENOL ) 500 MG tablet Take 1 tablet (500 mg total) by mouth every 6 (six) hours as needed. (Patient not taking: Reported on 02/26/2023)   cetirizine  (ZYRTEC ) 10 MG tablet Take 1 tablet (10 mg total) by mouth daily. (Patient not taking: Reported on 02/26/2023)   fluticasone  (FLONASE ) 50 MCG/ACT nasal spray Instill 2 puffs in each nostril daily. (Patient not  taking: Reported on 02/26/2023)   omeprazole  (PRILOSEC) 20 MG capsule Take 1 capsule (20 mg total) by mouth daily. (Patient not taking: Reported on 02/26/2023)   ondansetron  (ZOFRAN -ODT) 4 MG disintegrating tablet Take 1 tablet (4 mg total) by mouth every 8 (eight) hours as needed for nausea or vomiting. (Patient not taking: Reported on 02/26/2023)   pantoprazole  (PROTONIX ) 40 MG tablet Take 1 tablet (40 mg total) by mouth daily. (Patient not taking: Reported on 02/26/2023)   Prenat-Fe Poly-Methfol-FA-DHA (VITAFOL  ULTRA) 29-0.6-0.4-200 MG CAPS Take 1 tablet by mouth daily. (Patient not taking: Reported on 02/26/2023)   sucralfate  (CARAFATE ) 1 g tablet Take 1 tablet (1 g total) by mouth 4 (four) times daily -  with meals and at bedtime. (Patient not taking: Reported on 02/26/2023)   Vitamin D , Ergocalciferol , (DRISDOL ) 1.25 MG (50000 UNIT) CAPS capsule Take 1 capsule (50,000 Units total) by mouth every 7 (seven) days. (Patient not taking: Reported on 02/26/2023)   [DISCONTINUED] ONDANSETRON  HCL PO Take by mouth.   No facility-administered encounter medications on file as of 02/26/2023.    Past Medical History:  Diagnosis Date   Allergic conjunctivitis and rhinitis 04/02/2022   Chronic low back pain with right-sided sciatica    Chronic otitis externa of right ear 04/02/2022   GERD (gastroesophageal reflux disease)    Intractable migraine with aura with status migrainosus 08/20/2022   Prediabetes 06/23/2022   Vitamin D  deficiency 06/23/2022    Past Surgical History:  Procedure Laterality Date   NO PAST SURGERIES      Family History  Family history unknown: Yes    Social History   Socioeconomic History   Marital status: Married    Spouse name: Not on file   Number of children: 3   Years of education: Not on file   Highest education level: Not on file  Occupational History   Not on file  Tobacco Use   Smoking status: Never    Passive exposure: Never   Smokeless tobacco: Never  Vaping Use    Vaping status: Never Used  Substance and Sexual Activity   Alcohol use: Never   Drug use: Never   Sexual activity: Yes    Birth control/protection: None  Other Topics Concern   Not on file  Social History Narrative   Not on file   Social Drivers of Health   Financial Resource Strain: Not on file  Food Insecurity: Low Risk  (09/09/2022)   Received from [facility]   Hunger Vital Sign    Worried About Running Out of Food in the Last Year: Never true    Ran Out of Food in the Last Year: Never true  Transportation Needs: Not on file (09/09/2022)  Physical Activity: Not on file  Stress: Not on file  Social Connections: Not on file  Intimate Partner Violence: Not on file    Objective   BP 124/82 (BP Location: Left Arm, Patient Position: Sitting, Cuff Size: Normal)   Pulse 100   Ht 5' 5 (1.651 m)   Wt 166 lb 8 oz (75.5 kg)   SpO2 98%   BMI 27.71 kg/m   Physical Exam  53 year old female in no acute distress Cardiovascular exam with regular rate and rhythm Lungs clear to auscultation bilaterally  Assessment & Plan:   Special screening for malignant neoplasms, colon -     Ambulatory referral to Gastroenterology  Acute pain of both shoulders Assessment & Plan: Recommend proceeding with conservative management, recommend referral to physical therapy, patient amenable, referral placed today.  Recommend home exercise program as per PT.  Can continue with OTC medications to help with pain control.  Will monitor progress at follow-up visits.  Orders: -     Ambulatory referral to Physical Therapy  Right low back pain, unspecified chronicity, unspecified whether sciatica present Assessment & Plan: Chronic issue for patient.  Has been following with orthopedic surgeon regarding this, recommend continued follow-up with Ortho   Gastroesophageal reflux disease, unspecified whether esophagitis present Assessment & Plan: Chronic issue for patient.  Has utilized Carafate  in the  past.  Overdue for follow-up with GI, referral placed today  Orders: -     Ambulatory referral to Gastroenterology  Return in about 4 months (around 06/26/2023) for CPE with fasting labs 1 week prior.    ___________________________________________ Chandni Gagan de [place], MD, ABFM, CAQSM Primary Care and Sports Medicine [facility]

## 2023-03-05 ENCOUNTER — Ambulatory Visit: Payer: Medicaid Other | Attending: Family Medicine

## 2023-03-05 ENCOUNTER — Telehealth: Payer: Self-pay

## 2023-03-05 DIAGNOSIS — M6281 Muscle weakness (generalized): Secondary | ICD-10-CM | POA: Insufficient documentation

## 2023-03-05 DIAGNOSIS — R293 Abnormal posture: Secondary | ICD-10-CM | POA: Insufficient documentation

## 2023-03-05 DIAGNOSIS — R252 Cramp and spasm: Secondary | ICD-10-CM | POA: Insufficient documentation

## 2023-03-05 DIAGNOSIS — M25512 Pain in left shoulder: Secondary | ICD-10-CM | POA: Insufficient documentation

## 2023-03-05 DIAGNOSIS — M25511 Pain in right shoulder: Secondary | ICD-10-CM | POA: Insufficient documentation

## 2023-03-05 NOTE — Telephone Encounter (Signed)
Patient had a scheduled appointment at 8 am.  Patient had not arrived by 8:17 am.  Had interpreter call to inquire but no answer and no voice mail option.

## 2023-03-05 NOTE — Therapy (Deleted)
OUTPATIENT PHYSICAL THERAPY UPPER EXTREMITY EVALUATION   Patient Name: Stacy Conner MRN: [id number] DOB:1970-04-18, 53 y.o., female Today's Date: 03/05/2023  END OF SESSION:   Past Medical History:  Diagnosis Date   Allergic conjunctivitis and rhinitis 04/02/2022   Chronic low back pain with right-sided sciatica    Chronic otitis externa of right ear 04/02/2022   GERD (gastroesophageal reflux disease)    Intractable migraine with aura with status migrainosus 08/20/2022   Prediabetes 06/23/2022   Vitamin D deficiency 06/23/2022   Past Surgical History:  Procedure Laterality Date   NO PAST SURGERIES     Patient Active Problem List   Diagnosis Date Noted   Shoulder pain 02/26/2023   Elevated BP without diagnosis of hypertension 12/05/2022   Encounter for examination following treatment at hospital 12/05/2022   Dysfunction of right eustachian tube 09/09/2022   Referred otalgia of right ear 09/09/2022   Screening for colon cancer 08/20/2022   Intractable migraine with aura with status migrainosus 08/20/2022   Prediabetes 06/23/2022   Vitamin D deficiency 06/23/2022   Allergic rhinitis 06/20/2022   GERD (gastroesophageal reflux disease) 06/20/2022   Screening for endocrine, nutritional, metabolic and immunity disorder 06/20/2022   Chronic otitis externa of right ear 04/02/2022   Health care maintenance 04/02/2022   Allergic conjunctivitis and rhinitis 04/02/2022   Low back pain 07/22/2021    PCP: de [place], Raymond J, MD  REFERRING PROVIDER: de [place], Raymond J, MD  REFERRING DIAG: [phone] (ICD-10-CM) - Acute pain of both shoulders  THERAPY DIAG:  Acute pain of right shoulder  Acute pain of left shoulder  Muscle weakness (generalized)  Cramp and spasm  Abnormal posture  Rationale for Evaluation and Treatment: Rehabilitation  ONSET DATE: 02/26/2023  SUBJECTIVE:                                                                                                                                                                                       SUBJECTIVE STATEMENT: *** Hand dominance: {MISC; OT HAND DOMINANCE:[phone]}  PERTINENT HISTORY: ***  PAIN:  Are you having pain? Yes: NPRS scale: *** Pain location: *** Pain description: *** Aggravating factors: *** Relieving factors: ***  PRECAUTIONS: {Therapy precautions:24002}  RED FLAGS: None   WEIGHT BEARING RESTRICTIONS: No  FALLS:  Has patient fallen in last 6 months? {fallsyesno:27318}  LIVING ENVIRONMENT: Lives with: {OPRC lives with:25569::"lives with their family"} Lives in: {Lives in:25570} Stairs: {opstairs:27293} Has following equipment at home: {Assistive devices:23999}  OCCUPATION: ***  PLOF: {PLOF:24004}  PATIENT GOALS: ***  NEXT MD VISIT: ***  OBJECTIVE:  Note: Objective measures were completed at Evaluation unless otherwise noted.  DIAGNOSTIC FINDINGS:  ***  PATIENT SURVEYS :  Quick Dash ***  COGNITION: Overall cognitive status: {cognition:24006}     SENSATION: {sensation:27233}  POSTURE: ***  UPPER EXTREMITY ROM:   {AROM/PROM:27142} ROM Right eval Left eval  Shoulder flexion    Shoulder extension    Shoulder abduction    Shoulder adduction    Shoulder internal rotation    Shoulder external rotation    Elbow flexion    Elbow extension    Wrist flexion    Wrist extension    Wrist ulnar deviation    Wrist radial deviation    Wrist pronation    Wrist supination    (Blank rows = not tested)  UPPER EXTREMITY MMT:  MMT Right eval Left eval  Shoulder flexion    Shoulder extension    Shoulder abduction    Shoulder adduction    Shoulder internal rotation    Shoulder external rotation    Middle trapezius    Lower trapezius    Elbow flexion    Elbow extension    Wrist flexion    Wrist extension    Wrist ulnar deviation    Wrist radial deviation    Wrist pronation    Wrist supination    Grip strength (lbs)     (Blank rows = not tested)  SHOULDER SPECIAL TESTS: Impingement tests: {shoulder impingement test:25231:a} SLAP lesions: {SLAP lesions:25232} Instability tests: {shoulder instability test:25233} Rotator cuff assessment: {rotator cuff assessment:25234} Biceps assessment: {biceps assessment:25235}  JOINT MOBILITY TESTING:  ***  PALPATION:  ***                                                                                                                             TREATMENT DATE: ***   PATIENT EDUCATION: Education details: *** Person educated: {Person educated:25204} Education method: {Education Method:25205} Education comprehension: {Education Comprehension:25206}  HOME EXERCISE PROGRAM: ***  ASSESSMENT:  CLINICAL IMPRESSION: Patient is a *** y.o. *** who was seen today for physical therapy evaluation and treatment for ***.    OBJECTIVE IMPAIRMENTS: {opptimpairments:25111}.   ACTIVITY LIMITATIONS: {activitylimitations:27494}  PARTICIPATION LIMITATIONS: {participationrestrictions:25113}  PERSONAL FACTORS: {Personal factors:25162} are also affecting patient's functional outcome.   REHAB POTENTIAL: {rehabpotential:25112}  CLINICAL DECISION MAKING: {clinical decision making:25114}  EVALUATION COMPLEXITY: {Evaluation complexity:25115}  GOALS: Goals reviewed with patient? {yes/no:20286}  SHORT TERM GOALS: Target date: ***  *** Baseline: Goal status: {GOALSTATUS:25110}  2.  *** Baseline:  Goal status: {GOALSTATUS:25110}  3.  *** Baseline:  Goal status: {GOALSTATUS:25110}  4.  *** Baseline:  Goal status: {GOALSTATUS:25110}  5.  *** Baseline:  Goal status: {GOALSTATUS:25110}  6.  *** Baseline:  Goal status: {GOALSTATUS:25110}  LONG TERM GOALS: Target date: ***  *** Baseline:  Goal status: {GOALSTATUS:25110}  2.  *** Baseline:  Goal status: {GOALSTATUS:25110}  3.  *** Baseline:  Goal status: {GOALSTATUS:25110}  4.  *** Baseline:   Goal status: {GOALSTATUS:25110}  5.  *** Baseline:  Goal status: {GOALSTATUS:25110}  6.  *** Baseline:  Goal status: {  GOALSTATUS:25110}  PLAN: PT FREQUENCY: {rehab frequency:25116}  PT DURATION: {rehab duration:25117}  PLANNED INTERVENTIONS: 97110-Therapeutic exercises, 97530- Therapeutic activity, [id number]- Neuromuscular re-education, 97535- Self Care, 16109- Manual therapy, [id number]- Aquatic Therapy, 97014- Electrical stimulation (unattended), [id number]- Electrical stimulation (manual), [id number]- Vasopneumatic device, [id number]- Ultrasound, [id number]- Traction (mechanical), [id number]- Ionotophoresis 4mg /ml Dexamethasone, Patient/Family education, Taping, Dry Needling, Joint mobilization, Vestibular training, Visual/preceptual remediation/compensation, Cryotherapy, and Moist heat  PLAN FOR NEXT SESSION: Review HEP, begin shoulder strengthening and stability training, ice to both shoulders if indication at end of session.    Vernell Barrier, PT 03/05/2023, 8:04 AM

## 2023-03-19 NOTE — Assessment & Plan Note (Signed)
Chronic issue for patient.  Has been following with orthopedic surgeon regarding this, recommend continued follow-up with Ortho

## 2023-03-19 NOTE — Assessment & Plan Note (Signed)
Recommend proceeding with conservative management, recommend referral to physical therapy, patient amenable, referral placed today.  Recommend home exercise program as per PT.  Can continue with OTC medications to help with pain control.  Will monitor progress at follow-up visits.

## 2023-03-19 NOTE — Assessment & Plan Note (Signed)
Chronic issue for patient.  Has utilized Carafate in the past.  Overdue for follow-up with GI, referral placed today

## 2023-03-24 ENCOUNTER — Encounter: Payer: Self-pay | Admitting: Gastroenterology

## 2023-04-14 ENCOUNTER — Ambulatory Visit: Payer: Medicaid Other | Admitting: Gastroenterology

## 2023-04-16 ENCOUNTER — Encounter: Payer: Self-pay | Admitting: Internal Medicine

## 2023-05-20 ENCOUNTER — Encounter (HOSPITAL_COMMUNITY): Payer: Self-pay

## 2023-05-20 ENCOUNTER — Other Ambulatory Visit (HOSPITAL_BASED_OUTPATIENT_CLINIC_OR_DEPARTMENT_OTHER): Payer: Medicaid Other

## 2023-05-20 ENCOUNTER — Emergency Department (HOSPITAL_COMMUNITY)

## 2023-05-20 ENCOUNTER — Emergency Department (HOSPITAL_COMMUNITY)
Admission: EM | Admit: 2023-05-20 | Discharge: 2023-05-20 | Disposition: A | Discharge status: Home / Self Care | Attending: Emergency Medicine | Admitting: Emergency Medicine

## 2023-05-20 ENCOUNTER — Other Ambulatory Visit: Payer: Self-pay

## 2023-05-20 DIAGNOSIS — Y92410 Unspecified street and highway as the place of occurrence of the external cause: Secondary | ICD-10-CM | POA: Diagnosis not present

## 2023-05-20 DIAGNOSIS — Z0000 Encounter for general adult medical examination without abnormal findings: Secondary | ICD-10-CM

## 2023-05-20 DIAGNOSIS — E559 Vitamin D deficiency, unspecified: Secondary | ICD-10-CM

## 2023-05-20 DIAGNOSIS — S161XXA Strain of muscle, fascia and tendon at neck level, initial encounter: Secondary | ICD-10-CM | POA: Diagnosis not present

## 2023-05-20 DIAGNOSIS — M545 Low back pain, unspecified: Secondary | ICD-10-CM | POA: Insufficient documentation

## 2023-05-20 DIAGNOSIS — M542 Cervicalgia: Secondary | ICD-10-CM | POA: Diagnosis present

## 2023-05-20 LAB — POC URINE PREG, ED: Preg Test, Ur: NEGATIVE

## 2023-05-20 MED ORDER — CETIRIZINE HCL 5 MG/5ML PO SOLN
5.0000 mg | Freq: Once | ORAL | Status: DC
  Filled 2023-05-20: qty 5

## 2023-05-20 MED ORDER — CETIRIZINE HCL 10 MG PO CHEW: 10.0000 mg | CHEWABLE_TABLET | Freq: Every day | ORAL | 0 refills | Status: DC

## 2023-05-20 MED ORDER — CYCLOBENZAPRINE HCL 5 MG PO TABS: 5.0000 mg | ORAL_TABLET | Freq: Three times a day (TID) | ORAL | 0 refills | Status: DC | PRN

## 2023-05-20 MED ORDER — OXYCODONE-ACETAMINOPHEN 5-325 MG PO TABS
1.0000 | ORAL_TABLET | Freq: Once | ORAL | Status: AC
  Administered 2023-05-20: 1 via ORAL
  Filled 2023-05-20: qty 1

## 2023-05-20 MED ORDER — CETIRIZINE HCL 10 MG PO TABS
10.0000 mg | ORAL_TABLET | Freq: Once | ORAL | Status: AC
  Administered 2023-05-20: 10 mg via ORAL
  Filled 2023-05-20: qty 1
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
## 2023-05-21 LAB — COMPREHENSIVE METABOLIC PANEL WITH GFR
ALT: 19 IU/L (ref 0–32)
AST: 24 IU/L (ref 0–40)
Albumin: 4.4 g/dL (ref 3.8–4.9)
Alkaline Phosphatase: 76 IU/L (ref 44–121)
BUN/Creatinine Ratio: 30 — ABNORMAL HIGH (ref 9–23)
BUN: 16 mg/dL (ref 6–24)
Bilirubin Total: 0.3 mg/dL (ref 0.0–1.2)
CO2: 24 mmol/L (ref 20–29)
Calcium: 9.3 mg/dL (ref 8.7–10.2)
Chloride: 102 mmol/L (ref 96–106)
Creatinine, Ser: 0.54 mg/dL — ABNORMAL LOW (ref 0.57–1.00)
Globulin, Total: 3.2 g/dL (ref 1.5–4.5)
Glucose: 106 mg/dL — ABNORMAL HIGH (ref 70–99)
Potassium: 3.9 mmol/L (ref 3.5–5.2)
Sodium: 140 mmol/L (ref 134–144)
Total Protein: 7.6 g/dL (ref 6.0–8.5)
eGFR: 111 mL/min/{1.73_m2} (ref >59)

## 2023-05-21 LAB — CBC WITH DIFFERENTIAL/PLATELET
Basophils Absolute: 0 10*3/uL (ref 0.0–0.2)
Basos: 1 %
EOS (ABSOLUTE): 0.5 10*3/uL — ABNORMAL HIGH (ref 0.0–0.4)
Eos: 8 %
Hematocrit: 43.2 % (ref 34.0–46.6)
Hemoglobin: 14.1 g/dL (ref 11.1–15.9)
Immature Grans (Abs): 0 10*3/uL (ref 0.0–0.1)
Immature Granulocytes: 0 %
Lymphocytes Absolute: 2.7 10*3/uL (ref 0.7–3.1)
Lymphs: 40 %
MCH: 30.3 pg (ref 26.6–33.0)
MCHC: 32.6 g/dL (ref 31.5–35.7)
MCV: 93 fL (ref 79–97)
Monocytes Absolute: 0.6 10*3/uL (ref 0.1–0.9)
Monocytes: 9 %
Neutrophils Absolute: 2.9 10*3/uL (ref 1.4–7.0)
Neutrophils: 42 %
Platelets: 283 10*3/uL (ref 150–450)
RBC: 4.65 x10E6/uL (ref 3.77–5.28)
RDW: 12.3 % (ref 11.7–15.4)
WBC: 6.8 10*3/uL (ref 3.4–10.8)

## 2023-05-21 LAB — LIPID PANEL
Chol/HDL Ratio: 3.5 ratio (ref 0.0–4.4)
Cholesterol, Total: 222 mg/dL — ABNORMAL HIGH (ref 100–199)
HDL: 64 mg/dL (ref >39)
LDL Chol Calc (NIH): 147 mg/dL — ABNORMAL HIGH (ref 0–99)
Triglycerides: 61 mg/dL (ref 0–149)
VLDL Cholesterol Cal: 11 mg/dL (ref 5–40)

## 2023-05-21 LAB — VITAMIN D 25 HYDROXY (VIT D DEFICIENCY, FRACTURES): Vit D, 25-Hydroxy: 14.5 ng/mL — ABNORMAL LOW (ref 30.0–100.0)

## 2023-05-21 LAB — TSH RFX ON ABNORMAL TO FREE T4: TSH: 1.86 u[IU]/mL (ref 0.450–4.500)

## 2023-05-21 LAB — HEMOGLOBIN A1C
Est. average glucose Bld gHb Est-mCnc: 114 mg/dL
Hgb A1c MFr Bld: 5.6 % (ref 4.8–5.6)

## 2023-05-27 ENCOUNTER — Encounter (HOSPITAL_BASED_OUTPATIENT_CLINIC_OR_DEPARTMENT_OTHER): Payer: Self-pay | Admitting: Family Medicine

## 2023-05-27 ENCOUNTER — Ambulatory Visit (INDEPENDENT_AMBULATORY_CARE_PROVIDER_SITE_OTHER): Payer: Medicaid Other | Admitting: Family Medicine

## 2023-05-27 VITALS — BP 135/85 | HR 96 | Ht 64.0 in | Wt 160.9 lb

## 2023-05-27 DIAGNOSIS — Z1211 Encounter for screening for malignant neoplasm of colon: Secondary | ICD-10-CM

## 2023-05-27 DIAGNOSIS — Z0000 Encounter for general adult medical examination without abnormal findings: Secondary | ICD-10-CM | POA: Diagnosis not present

## 2023-05-27 DIAGNOSIS — E559 Vitamin D deficiency, unspecified: Secondary | ICD-10-CM | POA: Diagnosis not present

## 2023-05-27 MED ORDER — VITAMIN D (ERGOCALCIFEROL) 1.25 MG (50000 UNIT) PO CAPS: 50000.0000 [IU] | ORAL_CAPSULE | ORAL | 0 refills | Status: DC

## 2023-05-27 NOTE — Assessment & Plan Note (Addendum)
 Routine HCM labs reviewed. HCM reviewed/discussed. Anticipatory guidance regarding healthy weight, lifestyle and choices given. Recommend healthy diet.  Recommend approximately 150 minutes/week of moderate intensity exercise Recommend regular dental and vision exams Always use seatbelt/lap and shoulder restraints Recommend using smoke alarms and checking batteries at least twice a year Recommend using sunscreen when outside Discussed colon cancer screening recommendations, options.  Order for Cologuard placed Discussed recommendations for shingles vaccine.  Patient will consider Discussed tetanus immunization recommendations, patient agreed to proceed with this today

## 2023-05-27 NOTE — Assessment & Plan Note (Signed)
 Vit D low on recent labs. Recommend resuming vitamin D supplementation with high-dose formulation, prescription sent to pharmacy. Plan to recheck levels in a few months

## 2023-05-27 NOTE — Progress Notes (Signed)
 Subjective:    CC: Annual Physical Exam  HPI: Amberlee Garvey is a 53 y.o. presenting for annual physical Video Interpreter [phone]  I reviewed the past medical history, family history, social history, surgical history, and allergies today and no changes were needed.  Please see the problem list section below in epic for further details.  Past Medical History: Past Medical History:  Diagnosis Date   Allergic conjunctivitis and rhinitis 04/02/2022   Chronic low back pain with right-sided sciatica    Chronic otitis externa of right ear 04/02/2022   GERD (gastroesophageal reflux disease)    Intractable migraine with aura with status migrainosus 08/20/2022   Prediabetes 06/23/2022   Vitamin D deficiency 06/23/2022   Past Surgical History: Past Surgical History:  Procedure Laterality Date   NO PAST SURGERIES     Social History: Social History   Socioeconomic History   Marital status: Married    Spouse name: Not on file   Number of children: 3   Years of education: Not on file   Highest education level: Not on file  Occupational History   Not on file  Tobacco Use   Smoking status: Never    Passive exposure: Never   Smokeless tobacco: Never  Vaping Use   Vaping status: Never Used  Substance and Sexual Activity   Alcohol use: Never   Drug use: Never   Sexual activity: Yes    Birth control/protection: None  Other Topics Concern   Not on file  Social History Narrative   Not on file   Social Drivers of Health   Financial Resource Strain: Not on file  Food Insecurity: Low Risk  (09/09/2022)   Received from [facility]   Hunger Vital Sign    Worried About Running Out of Food in the Last Year: Never true    Ran Out of Food in the Last Year: Never true  Transportation Needs: Not on file (09/09/2022)  Physical Activity: Not on file  Stress: Not on file  Social Connections: Not on file   Family History: Family History  Family history unknown: Yes    Allergies: Allergies  Allergen Reactions   Penicillins    Medications: See med rec.  Review of Systems: No headache, visual changes, nausea, vomiting, diarrhea, constipation, dizziness, abdominal pain, skin rash, fevers, chills, night sweats, swollen lymph nodes, weight loss, chest pain, body aches, joint swelling, muscle aches, shortness of breath, mood changes, visual or auditory hallucinations.  Objective:    BP 135/85 (BP Location: Right Arm, Patient Position: Sitting, Cuff Size: Normal)   Pulse 96   Ht 5\' 4"  (1.626 m)   Wt 160 lb 14.4 oz (73 kg)   SpO2 100%   BMI 27.62 kg/m   General: Well Developed, well nourished, and in no acute distress.  Neuro: Alert and oriented x3, extra-ocular muscles intact, sensation grossly intact. Cranial nerves II through XII are intact, motor, sensory, and coordinative functions are all intact. HEENT: Normocephalic, atraumatic, pupils equal round reactive to light, neck supple, no masses, no lymphadenopathy, thyroid nonpalpable. Oropharynx, nasopharynx, external ear canals are unremarkable. Skin: Warm and dry, no rashes noted.  Cardiac: Regular rate and rhythm, no murmurs rubs or gallops.  Respiratory: Clear to auscultation bilaterally. Not using accessory muscles, speaking in full sentences.  Abdominal: Soft, nontender, nondistended, positive bowel sounds, no masses, no organomegaly.  Musculoskeletal: Shoulder, elbow, wrist, hip, knee, ankle stable, and with full range of motion.  Impression and Recommendations:    Wellness examination Assessment &  Plan: Routine HCM labs reviewed. HCM reviewed/discussed. Anticipatory guidance regarding healthy weight, lifestyle and choices given. Recommend healthy diet.  Recommend approximately 150 minutes/week of moderate intensity exercise Recommend regular dental and vision exams Always use seatbelt/lap and shoulder restraints Recommend using smoke alarms and checking batteries at least twice a  year Recommend using sunscreen when outside Discussed colon cancer screening recommendations, options.  Order for Cologuard placed Discussed recommendations for shingles vaccine.  Patient will consider Discussed tetanus immunization recommendations, patient agreed to proceed with this today   Special screening for malignant neoplasms, colon -     Cologuard  Vitamin D deficiency Assessment & Plan: Vit D low on recent labs. Recommend resuming vitamin D supplementation with high-dose formulation, prescription sent to pharmacy. Plan to recheck levels in a few months   Return in about 3 months (around 08/26/2023) for med check, 40 minutes.   ___________________________________________ Burney [place], MD, ABFM, CAQSM Primary Care and Sports Medicine [facility]

## 2023-05-27 NOTE — Patient Instructions (Addendum)
  Medication Instructions:  Your physician recommends that you continue on your current medications as directed. Please refer to the Current Medication list given to you today. --If you need a refill on any your medications before your next appointment, please call your pharmacy first. If no refills are authorized on file call the office.-- Lab Work: Your physician has recommended that you have lab work today: 1 week before next visit  If you have labs (blood work) drawn today and your tests are completely normal, you will receive your results via MyChart message OR a phone call from our staff.  Please ensure you check your voicemail in the event that you authorized detailed messages to be left on a delegated number. If you have any lab test that is abnormal or we need to change your treatment, we will call you to review the results.   Follow-Up: Your next appointment:   Your physician recommends that you schedule a follow-up appointment in: 3-4 month follow up  with Dr. de [place]  You will receive a text message or e-mail with a link to a survey about your care and experience with [place] today! We would greatly appreciate your feedback!   Thanks for letting us be apart of your health journey!!  Primary Care and Sports Medicine   Dr. Ceasar Mons [place]   We encourage you to activate your patient portal called "MyChart".  Sign up information is provided on this After Visit Summary.  MyChart is used to connect with patients for Virtual Visits (Telemedicine).  Patients are able to view lab/test results, encounter notes, upcoming appointments, etc.  Non-urgent messages can be sent to your provider as well. To learn more about what you can do with MyChart, please visit --  [email or web address].

## 2023-06-12 ENCOUNTER — Ambulatory Visit: Payer: Medicaid Other | Admitting: Internal Medicine

## 2023-06-19 ENCOUNTER — Ambulatory Visit: Admitting: Physician Assistant

## 2023-06-22 ENCOUNTER — Other Ambulatory Visit: Payer: Self-pay | Admitting: Nurse Practitioner

## 2023-06-22 DIAGNOSIS — K219 Gastro-esophageal reflux disease without esophagitis: Secondary | ICD-10-CM

## 2023-07-01 NOTE — Progress Notes (Signed)
 "   Chief Complaint: Epigastric pain, nausea, vomiting, GERD Primary GI MD: Unassigned  HPI: Discussed the use of AI scribe software for clinical note transcription with the patient, who gave verbal consent to proceed.  History of Present Illness Stacy Conner is a 53 year old female who presents with upper gastrointestinal symptoms including heartburn, stomach pain, nausea, and vomiting.  Interpreter is here as patient speaks Arabic/Sudanese  She experiences persistent stomach pain located in the epigastric area, which worsens with eating. Even drinking water can induce vomiting. She also reports heartburn, nausea, and vomiting. These symptoms are new and were not present while she was in [place].  She has been taking an unspecified antacid, which has not alleviated her symptoms. No changes in bowel movements, such as blood or black stools, are noted. No blood in stools, black stools, use of blood thinners, history of heart attacks, or strokes.  She feels very tired due to the ongoing symptoms.  There is a family history of colon cancer.  No previous EGD/colonoscopy.  Normal CBC, CMP, lipase, TSH.  Past Medical History:  Diagnosis Date   Allergic conjunctivitis and rhinitis 04/02/2022   Chronic low back pain with right-sided sciatica    Chronic otitis externa of right ear 04/02/2022   GERD (gastroesophageal reflux disease)    Intractable migraine with aura with status migrainosus 08/20/2022   Prediabetes 06/23/2022   Vitamin D  deficiency 06/23/2022    Past Surgical History:  Procedure Laterality Date   NO PAST SURGERIES      Current Outpatient Medications  Medication Sig Dispense Refill   acetaminophen  (TYLENOL ) 500 MG tablet Take 1 tablet (500 mg total) by mouth every 6 (six) hours as needed. (Patient taking differently: Take 500 mg by mouth as needed.) 30 tablet 0   cetirizine  (ZYRTEC ) 10 MG chewable tablet Chew 1 tablet (10 mg total) by mouth daily. 30 tablet 0    Cholecalciferol (VITAMIN D -3) 125 MCG (5000 UT) TABS Take 1 tablet by mouth daily.     Vitamin D , Ergocalciferol , (DRISDOL ) 1.25 MG (50000 UNIT) CAPS capsule Take 1 capsule (50,000 Units total) by mouth every 7 (seven) days. (Patient not taking: Reported on 07/02/2023) 10 capsule 0   No current facility-administered medications for this visit.    Allergies as of 07/02/2023 - Review Complete 07/02/2023  Allergen Reaction Noted   Penicillins  10/14/2021    Family History  Family history unknown: Yes    Social History   Socioeconomic History   Marital status: Married    Spouse name: Not on file   Number of children: 3   Years of education: Not on file   Highest education level: Not on file  Occupational History   Not on file  Tobacco Use   Smoking status: Never    Passive exposure: Never   Smokeless tobacco: Never  Vaping Use   Vaping status: Never Used  Substance and Sexual Activity   Alcohol use: Never   Drug use: Never   Sexual activity: Yes    Birth control/protection: None  Other Topics Concern   Not on file  Social History Narrative   Not on file   Social Drivers of Health   Financial Resource Strain: Not on file  Food Insecurity: Low Risk  (09/09/2022)   Received from [facility]   Hunger Vital Sign    Worried About Running Out of Food in the Last Year: Never true    Ran Out of Food in the Last Year:  Never true  Transportation Needs: Not on file (09/09/2022)  Physical Activity: Not on file  Stress: Not on file  Social Connections: Not on file  Intimate Partner Violence: Not on file    Review of Systems:    Constitutional: No weight loss, fever, chills, weakness or fatigue HEENT: Eyes: No change in vision               Ears, Nose, Throat:  No change in hearing or congestion Skin: No rash or itching Cardiovascular: No chest pain, chest pressure or palpitations   Respiratory: No SOB or cough Gastrointestinal: See HPI and otherwise  negative Genitourinary: No dysuria or change in urinary frequency Neurological: No headache, dizziness or syncope Musculoskeletal: No new muscle or joint pain Hematologic: No bleeding or bruising Psychiatric: No history of depression or anxiety    Physical Exam:  Vital signs: BP 124/82 (BP Location: Left Arm, Patient Position: Sitting, Cuff Size: Normal)   Pulse (!) 102   Ht 5' 3.5 (1.613 m) Comment: height measured without shoes  Wt 160 lb 2 oz (72.6 kg)   BMI 27.92 kg/m   Constitutional: NAD, alert and cooperative Head:  Normocephalic and atraumatic. Eyes:   PEERL, EOMI. No icterus. Conjunctiva pink. Respiratory: Respirations even and unlabored. Lungs clear to auscultation bilaterally.   No wheezes, crackles, or rhonchi.  Cardiovascular:  Regular rate and rhythm. No peripheral edema, cyanosis or pallor.  Gastrointestinal:  Soft, nondistended, nontender. No rebound or guarding. Normal bowel sounds. No appreciable masses or hepatomegaly. Rectal:  Declines Msk:  Symmetrical without gross deformities. Without edema, no deformity or joint abnormality.  Neurologic:  Alert and  oriented x4;  grossly normal neurologically.  Skin:   Dry and intact without significant lesions or rashes. Psychiatric: Oriented to person, place and time. Demonstrates good judgement and reason without abnormal affect or behaviors.  Physical Exam    RELEVANT LABS AND IMAGING: CBC    Component Value Date/Time   WBC 6.8 05/20/2023 0810   RBC 4.65 05/20/2023 0810   HGB 14.1 05/20/2023 0810   HCT 43.2 05/20/2023 0810   PLT 283 05/20/2023 0810   MCV 93 05/20/2023 0810   MCH 30.3 05/20/2023 0810   MCHC 32.6 05/20/2023 0810   RDW 12.3 05/20/2023 0810   LYMPHSABS 2.7 05/20/2023 0810   EOSABS 0.5 (H) 05/20/2023 0810   BASOSABS 0.0 05/20/2023 0810    CMP     Component Value Date/Time   NA 140 05/20/2023 0810   K 3.9 05/20/2023 0810   CL 102 05/20/2023 0810   CO2 24 05/20/2023 0810   GLUCOSE 106  (H) 05/20/2023 0810   BUN 16 05/20/2023 0810   CREATININE 0.54 (L) 05/20/2023 0810   CALCIUM 9.3 05/20/2023 0810   PROT 7.6 05/20/2023 0810   ALBUMIN 4.4 05/20/2023 0810   AST 24 05/20/2023 0810   ALT 19 05/20/2023 0810   ALKPHOS 76 05/20/2023 0810   BILITOT 0.3 05/20/2023 0810     Assessment/Plan:   Assessment & Plan  Epigastric pain Nausea and vomiting GERD Epigastric pain, GERD, nausea and vomiting worse with eating persistent for several months.  No previous EGD.  No previous testing for H. pylori.  DDx includes esophagitis, gastritis, PUD, H. pylori. - EGD for further evaluation with biopsies for H. pylori - Prefers female provider - I thoroughly discussed the procedure with the patient (at bedside) to include nature of the procedure, alternatives, benefits, and risks (including but not limited to bleeding, infection, perforation, anesthesia/cardiac pulmonary complications).  Patient verbalized understanding and gave verbal consent to proceed with procedure. - Will likely be put on PPI after EGD, will hold for now for accuracy of H. pylori biopsies  Colon cancer screening No previous colonoscopy.  No family history of colon cancer.  She like to hold off on screening colonoscopy until the above has been addressed - Received after the above has been addressed  Assigned to Dr. Federico based on procedure schedule availability  Nestor Mollie [facility]ON[facility] 07/02/2023, 3:37 PM  Cc: de [place], Raymond J, MD "

## 2023-07-02 ENCOUNTER — Ambulatory Visit (INDEPENDENT_AMBULATORY_CARE_PROVIDER_SITE_OTHER): Admitting: Gastroenterology

## 2023-07-02 ENCOUNTER — Encounter: Payer: Self-pay | Admitting: Gastroenterology

## 2023-07-02 VITALS — BP 124/82 | HR 102 | Ht 63.5 in | Wt 160.1 lb

## 2023-07-02 DIAGNOSIS — R112 Nausea with vomiting, unspecified: Secondary | ICD-10-CM

## 2023-07-02 DIAGNOSIS — Z1211 Encounter for screening for malignant neoplasm of colon: Secondary | ICD-10-CM

## 2023-07-02 DIAGNOSIS — K219 Gastro-esophageal reflux disease without esophagitis: Secondary | ICD-10-CM

## 2023-07-02 DIAGNOSIS — R1013 Epigastric pain: Secondary | ICD-10-CM | POA: Diagnosis not present

## 2023-07-02 NOTE — Patient Instructions (Addendum)
 You have been scheduled for an endoscopy. Please follow written instructions given to you at your visit today.  If you use inhalers (even only as needed), please bring them with you on the day of your procedure.  If you take any of the following medications, they will need to be adjusted prior to your procedure:   DO NOT TAKE 7 DAYS PRIOR TO TEST- Trulicity (dulaglutide) Ozempic, Wegovy (semaglutide) Mounjaro (tirzepatide) Bydureon Bcise (exanatide extended release)  DO NOT TAKE 1 DAY PRIOR TO YOUR TEST Rybelsus (semaglutide) Adlyxin (lixisenatide) Victoza (liraglutide) Byetta (exanatide) ___________________________________________________________________________   Thank you for trusting me with your gastrointestinal care!   Suzanna Erp, PA   If your blood pressure at your visit was 140/90 or greater, please contact your primary care physician to follow up on this. ______________________________________________________  If you are age 26 or older, your body mass index should be between 23-30. Your Body mass index is 27.92 kg/m. If this is out of the aforementioned range listed, please consider follow up with your Primary Care Provider.  If you are age 42 or younger, your body mass index should be between 19-25. Your Body mass index is 27.92 kg/m. If this is out of the aformentioned range listed, please consider follow up with your Primary Care Provider.  ________________________________________________________  The [facility] providers would like to encourage you to use MYCHART to communicate with providers for non-urgent requests or questions.  Due to long hold times on the telephone, sending your provider a message by [facility] may be a faster and more efficient way to get a response.  Please allow 48 business hours for a response.  Please remember that this is for non-urgent requests.  _______________________________________________________  Due to recent changes in  healthcare laws, you may see the results of your imaging and laboratory studies on MyChart before your provider has had a chance to review them.  We understand that in some cases there may be results that are confusing or concerning to you. Not all laboratory results come back in the same time frame and the provider may be waiting for multiple results in order to interpret others.  Please give us  48 hours in order for your provider to thoroughly review all the results before contacting the office for clarification of your results.

## 2023-07-06 NOTE — Progress Notes (Signed)
 I agree with the assessment and plan as outlined by Ms. McMichael.

## 2023-07-07 ENCOUNTER — Ambulatory Visit (AMBULATORY_SURGERY_CENTER): Admitting: Internal Medicine

## 2023-07-07 ENCOUNTER — Encounter: Payer: Self-pay | Admitting: Internal Medicine

## 2023-07-07 VITALS — BP 127/84 | HR 86 | Temp 98.1°F | Resp 17 | Ht 63.5 in | Wt 160.0 lb

## 2023-07-07 DIAGNOSIS — K449 Diaphragmatic hernia without obstruction or gangrene: Secondary | ICD-10-CM | POA: Diagnosis not present

## 2023-07-07 DIAGNOSIS — K2950 Unspecified chronic gastritis without bleeding: Secondary | ICD-10-CM

## 2023-07-07 DIAGNOSIS — K3189 Other diseases of stomach and duodenum: Secondary | ICD-10-CM

## 2023-07-07 DIAGNOSIS — K219 Gastro-esophageal reflux disease without esophagitis: Secondary | ICD-10-CM | POA: Diagnosis not present

## 2023-07-07 DIAGNOSIS — R1013 Epigastric pain: Secondary | ICD-10-CM

## 2023-07-07 MED ORDER — PANTOPRAZOLE SODIUM 40 MG PO TBEC: 40.0000 mg | DELAYED_RELEASE_TABLET | Freq: Two times a day (BID) | ORAL | 3 refills | Status: DC

## 2023-07-07 MED ORDER — SODIUM CHLORIDE 0.9 % IV SOLN: 500.0000 mL | Freq: Once | INTRAVENOUS | Status: DC

## 2023-07-07 NOTE — Progress Notes (Signed)
 Called to room to assist during endoscopic procedure.  Patient ID and intended procedure confirmed with present staff. Received instructions for my participation in the procedure from the performing physician.

## 2023-07-07 NOTE — Op Note (Addendum)
 [facility] Patient Name: Stacy Conner Procedure Date: 07/07/2023 10:32 AM MRN: [id number] Endoscopist: Stacy Conner , , [id number] Age: 53 Referring MD:  Date of Birth: [birth year] Gender: Female Account #: [id number] Procedure:                Upper GI endoscopy Indications:              Epigastric abdominal pain, Heartburn, Nausea with                            vomiting Medicines:                Monitored Anesthesia Care Procedure:                Pre-Anesthesia Assessment:                           - Prior to the procedure, a History and Physical                            was performed, and patient medications and                            allergies were reviewed. The patient's tolerance of                            previous anesthesia was also reviewed. The risks                            and benefits of the procedure and the sedation                            options and risks were discussed with the patient.                            All questions were answered, and informed consent                            was obtained. Prior Anticoagulants: The patient has                            taken no anticoagulant or antiplatelet agents. ASA                            Grade Assessment: II - A patient with mild systemic                            disease. After reviewing the risks and benefits,                            the patient was deemed in satisfactory condition to                            undergo the procedure.  After obtaining informed consent, the endoscope was                            passed under direct vision. Throughout the                            procedure, the patient's blood pressure, pulse, and                            oxygen saturations were monitored continuously. The                            GIF [id number] #[id number] was introduced through the                            mouth, and advanced to the second part of  duodenum.                            The upper GI endoscopy was accomplished without                            difficulty. The patient tolerated the procedure                            well. Scope In: Scope Out: Findings:                 The examined esophagus was normal.                           A small hiatal hernia was present.                           Localized mildly erythematous mucosa without                            bleeding was found in the gastric antrum. Biopsies                            were taken with a cold forceps for histology.                           The examined duodenum was normal. Biopsies were                            taken with a cold forceps for histology. Complications:            No immediate complications. Estimated Blood Loss:     Estimated blood loss was minimal. Impression:               - Normal esophagus.                           - Small hiatal hernia.                           -  Erythematous mucosa in the antrum. Biopsied.                           - Normal examined duodenum. Biopsied. Recommendation:           - Discharge patient to home (with escort).                           - Await pathology results.                           - Trial of pantoprazole  40 mg BID for 8 weeks, then                            QD.                           - Return to GI clinic in 2-3 months.                           - The findings and recommendations were discussed                            with the patient. Dr Stacy Conner "Stacy Conner" Stacy Conner,  07/07/2023 11:15:19 AM

## 2023-07-07 NOTE — Progress Notes (Signed)
 Report to PACU, RN, vss, BBS= Clear.

## 2023-07-07 NOTE — Progress Notes (Signed)
 Interpreter present throughout recovery period.  Stacy Conner

## 2023-07-07 NOTE — Patient Instructions (Signed)
 Take your pantoprazole  as ordered. Resume all of your other medications today as ordered. Read the handout given to you by your recovery room nurse. Read your discharge instructions.  You have a follow-up visit with Dr [name] on July 31st at 0950 am.   YOU HAD AN ENDOSCOPIC PROCEDURE TODAY AT THE [facility]:   Refer to the procedure report that was given to you for any specific questions about what was found during the examination.  If the procedure report does not answer your questions, please call your gastroenterologist to clarify.  If you requested that your care partner not be given the details of your procedure findings, then the procedure report has been included in a sealed envelope for you to review at your convenience later.  YOU SHOULD EXPECT: Some feelings of bloating in the abdomen. Passage of more gas than usual.  Walking can help get rid of the air that was put into your GI tract during the procedure and reduce the boating.  Please Note:  You might notice some irritation and congestion in your nose or some drainage.  This is from the oxygen used during your procedure.  There is no need for concern and it should clear up in a day or so.  SYMPTOMS TO REPORT IMMEDIATELY:   Following upper endoscopy (EGD)  Vomiting of blood or coffee ground material  New chest pain or pain under the shoulder blades  Painful or persistently difficult swallowing  New shortness of breath  Fever of 100F or higher  Black, tarry-looking stools  For urgent or emergent issues, a gastroenterologist can be reached at any hour by calling [phone]. Do not use MyChart messaging for urgent concerns.    DIET:  We do recommend a small meal at first, but then you may proceed to your regular diet.  Drink plenty of fluids but you should avoid alcoholic beverages for 24 hours.  ACTIVITY:  You should plan to take it easy for the rest of today and you should NOT DRIVE or use heavy machinery  until tomorrow (because of the sedation medicines used during the test).    FOLLOW UP: Our staff will call the number listed on your records the next business day following your procedure.  We will call around 7:15- 8:00 am to check on you and address any questions or concerns that you may have regarding the information given to you following your procedure. If we do not reach you, we will leave a message.     If any biopsies were taken you will be contacted by phone or by letter within the next 1-3 weeks.  Please call us  at [phone] if you have not heard about the biopsies in 3 weeks.    SIGNATURES/CONFIDENTIALITY: You and/or your care partner have signed paperwork which will be entered into your electronic medical record.  These signatures attest to the fact that that the information above on your After Visit Summary has been reviewed and is understood.  Full responsibility of the confidentiality of this discharge information lies with you and/or your care-partner.

## 2023-07-07 NOTE — Progress Notes (Signed)
 GASTROENTEROLOGY PROCEDURE H&P NOTE   Primary Care Physician: de [place], Alonza Jansky, MD    Reason for Procedure:   Epigastric ab pain, N&V, GERD  Plan:    EGD  Patient is appropriate for endoscopic procedure(s) in the ambulatory ([facility]) setting.  The nature of the procedure, as well as the risks, benefits, and alternatives were carefully and thoroughly reviewed with the patient. Ample time for discussion and questions allowed. The patient understood, was satisfied, and agreed to proceed.     HPI: Stacy Conner is a 53 y.o. female who presents for EGD for evaluation of epigastric ab pain, N&V, and GERD.  Patient was most recently seen in the Gastroenterology Clinic on 07/02/23.  No interval change in medical history since that appointment. Please refer to that note for full details regarding GI history and clinical presentation.   Past Medical History:  Diagnosis Date   Allergic conjunctivitis and rhinitis 04/02/2022   Chronic low back pain with right-sided sciatica    Chronic otitis externa of right ear 04/02/2022   GERD (gastroesophageal reflux disease)    Intractable migraine with aura with status migrainosus 08/20/2022   Prediabetes 06/23/2022   Vitamin D  deficiency 06/23/2022    Past Surgical History:  Procedure Laterality Date   NO PAST SURGERIES      Prior to Admission medications   Medication Sig Start Date End Date Taking? Authorizing Provider  acetaminophen  (TYLENOL ) 500 MG tablet Take 1 tablet (500 mg total) by mouth every 6 (six) hours as needed. Patient taking differently: Take 500 mg by mouth as needed. 08/20/22   Paseda, Folashade R, FNP  cetirizine  (ZYRTEC ) 10 MG chewable tablet Chew 1 tablet (10 mg total) by mouth daily. 05/20/23   Small, Brooke L, PA  Cholecalciferol (VITAMIN D -3) 125 MCG (5000 UT) TABS Take 1 tablet by mouth daily.    [provider]  Vitamin D , Ergocalciferol , (DRISDOL ) 1.25 MG (50000 UNIT) CAPS capsule Take 1 capsule  (50,000 Units total) by mouth every 7 (seven) days. Patient not taking: Reported on 07/02/2023 05/27/23   de [place], Alonza Jansky, MD    Current Outpatient Medications  Medication Sig Dispense Refill   cetirizine  (ZYRTEC ) 10 MG chewable tablet Chew 1 tablet (10 mg total) by mouth daily. 30 tablet 0   Cholecalciferol (VITAMIN D -3) 125 MCG (5000 UT) TABS Take 1 tablet by mouth daily.     acetaminophen  (TYLENOL ) 500 MG tablet Take 1 tablet (500 mg total) by mouth every 6 (six) hours as needed. (Patient taking differently: Take 500 mg by mouth as needed.) 30 tablet 0   Vitamin D , Ergocalciferol , (DRISDOL ) 1.25 MG (50000 UNIT) CAPS capsule Take 1 capsule (50,000 Units total) by mouth every 7 (seven) days. (Patient not taking: Reported on 07/07/2023) 10 capsule 0   Current Facility-Administered Medications  Medication Dose Route Frequency Provider Last Rate Last Admin   0.9 %  sodium chloride infusion  500 mL Intravenous Once Tierria Watson C, MD        Allergies as of 07/07/2023 - Review Complete 07/07/2023  Allergen Reaction Noted   Penicillins Itching and Palpitations 10/14/2021    Family History  Problem Relation Age of Onset   Colon cancer Neg Hx    Esophageal cancer Neg Hx    Rectal cancer Neg Hx    Stomach cancer Neg Hx     Social History   Socioeconomic History   Marital status: Married    Spouse name: Not on file   Number  of children: 3   Years of education: Not on file   Highest education level: Not on file  Occupational History   Not on file  Tobacco Use   Smoking status: Never    Passive exposure: Never   Smokeless tobacco: Never  Vaping Use   Vaping status: Never Used  Substance and Sexual Activity   Alcohol use: Never   Drug use: Never   Sexual activity: Yes    Birth control/protection: None  Other Topics Concern   Not on file  Social History Narrative   Not on file   Social Drivers of Health   Financial Resource Strain: Not on file  Food Insecurity: Low Risk   (09/09/2022)   Received from [facility]   Hunger Vital Sign    Worried About Running Out of Food in the Last Year: Never true    Ran Out of Food in the Last Year: Never true  Transportation Needs: Not on file (09/09/2022)  Physical Activity: Not on file  Stress: Not on file  Social Connections: Not on file  Intimate Partner Violence: Not on file    Physical Exam: Vital signs in last 24 hours: BP (!) 151/89   Pulse (!) 109 Comment: Pt denies any signs or symptoms  Temp 98.1 F (36.7 C) (Temporal)   Ht 5' 3.5" (1.613 m)   Wt 160 lb (72.6 kg)   LMP 07/06/2023   SpO2 100%   BMI 27.90 kg/m  GEN: NAD EYE: Sclerae anicteric ENT: MMM CV: Non-tachycardic Pulm: No increased WOB GI: Soft NEURO:  Alert & Oriented   Regino Caprio, MD [facility]   07/07/2023 10:56 AM

## 2023-07-07 NOTE — Progress Notes (Signed)
 Pt's states no medical or surgical changes since previsit or office visit.   Interpreter used today at the [facility] for this pt.  Interpreter's name is-Nuha

## 2023-07-08 ENCOUNTER — Telehealth: Payer: Self-pay | Admitting: Lactation Services

## 2023-07-08 NOTE — Telephone Encounter (Signed)
  Follow up Call-     07/07/2023   10:19 AM  Call back number  Post procedure Call Back phone  # [phone]  Permission to leave phone message Yes     Patient questions:  Do you have a fever, pain , or abdominal swelling? No. Pain Score  0 *  Have you tolerated food without any problems? Yes.    Have you been able to return to your normal activities? Yes.    Do you have any questions about your discharge instructions: Diet   No. Medications  No. Follow up visit  No.  Do you have questions or concerns about your Care? No.  Actions: * If pain score is 4 or above: No action needed, pain <4.

## 2023-07-09 ENCOUNTER — Ambulatory Visit: Payer: Self-pay | Admitting: Internal Medicine

## 2023-07-09 LAB — SURGICAL PATHOLOGY

## 2023-07-15 ENCOUNTER — Telehealth: Payer: Self-pay

## 2023-07-15 NOTE — Telephone Encounter (Signed)
 I was informed by case manager that patient has been taking prescription vitamin D  daily instead of once weekly. Patient complaining of headaches but patient has history for chronic headaches. I have called patient and she has both weekly and daily doses. Not certain if she is taking daily or weakly. I have contacted poison control and advised that she should watch for GI symptoms like diarrhea,anorexia etc. If symptoms get worse, patient should go to ER for lab check.  Case manager was able to move her appointment date closer.   Perla Bradford Nico Rogness RN BSN PCCN  [facility] [phone] [phone]

## 2023-08-03 ENCOUNTER — Telehealth (HOSPITAL_BASED_OUTPATIENT_CLINIC_OR_DEPARTMENT_OTHER): Payer: Self-pay | Admitting: *Deleted

## 2023-08-03 NOTE — Telephone Encounter (Signed)
 Copied from CRM [id number]. Topic: Clinical - Lab/Test Results >> Aug 03, 2023  4:05 PM Tiffany S wrote: Reason for CRM: Jonel Nephew from  poison control need most recent labs on patient  [phone]

## 2023-08-03 NOTE — Telephone Encounter (Signed)
 Please have poison control fax over the request for this information we cannot provide this over the phone

## 2023-08-04 ENCOUNTER — Telehealth (HOSPITAL_BASED_OUTPATIENT_CLINIC_OR_DEPARTMENT_OTHER): Payer: Self-pay | Admitting: *Deleted

## 2023-08-04 NOTE — Telephone Encounter (Signed)
 Called poison control. Was not able to request information to be faxed due to HIPAA.

## 2023-08-04 NOTE — Telephone Encounter (Signed)
 Copied from CRM [id number]. Topic: General - Other >> Aug 04, 2023  2:25 PM Zipporah Him wrote: Reason for CRM: Edwina Gram from poison control center calling to follow up on this patient in regard to taking more than written dose of her vitamin d . Please call when available. Requesting to speak with a nurse no answer at [facility] x2.    ----------------------------------------------------------------------- From previous Reason for Contact - Medical Advice: Reason for CRM:

## 2023-08-04 NOTE — Telephone Encounter (Signed)
 Called  patient regarding taking more than vitamin d  dose she took 1 tablet each day for 3 days. Spoke with provider provider said to hold off for 2 weeks and then restart vitamin d  1 tablet 1 time a week. Patient advised using interpreter services with verbal understanding She was told vitamin d  is hard on her body she will wait until 7/1 for her appt with provider to start back FYI

## 2023-08-05 NOTE — Telephone Encounter (Unsigned)
 Copied from CRM [id number]. Topic: Clinical - Medical Advice >> Aug 05, 2023  2:22 PM Tiffini S wrote: Reason for CRM:  Elisabeth Guild [phone] with the Poison control center called about the patient labs and questions about the vitamin D . Please call back at [phone].

## 2023-08-18 ENCOUNTER — Ambulatory Visit (HOSPITAL_BASED_OUTPATIENT_CLINIC_OR_DEPARTMENT_OTHER): Admitting: Family Medicine

## 2023-08-19 ENCOUNTER — Ambulatory Visit (HOSPITAL_BASED_OUTPATIENT_CLINIC_OR_DEPARTMENT_OTHER): Admitting: Family Medicine

## 2023-08-28 ENCOUNTER — Ambulatory Visit (HOSPITAL_BASED_OUTPATIENT_CLINIC_OR_DEPARTMENT_OTHER): Admitting: Family Medicine

## 2023-09-01 LAB — GLUCOSE, POCT (MANUAL RESULT ENTRY): POC Glucose: 97 mg/dL (ref 70–99)

## 2023-09-01 NOTE — Congregational Nurse Program (Signed)
  Dept: [phone]   Congregational Nurse Program Note  Date of Encounter: 09/01/2023  Past Medical History: Past Medical History:  Diagnosis Date   Allergic conjunctivitis and rhinitis 04/02/2022   Chronic low back pain with right-sided sciatica    Chronic otitis externa of right ear 04/02/2022   GERD (gastroesophageal reflux disease)    Intractable migraine with aura with status migrainosus 08/20/2022   Prediabetes 06/23/2022   Vitamin D  deficiency 06/23/2022    Encounter Details:  Community Questionnaire - 09/01/23 1210       Questionnaire   Ask client: Do you give verbal consent for me to treat you today? Yes    Student Assistance Medical Student    Location Patient Served  [facility]    Encounter Setting CN site    Population Status Migrant/Refugee    Insurance Medicaid    Insurance/Financial Assistance Referral Medicaid    Medication Provided Medication Assistance;Have Medication Insecurities;Patient Medications Reviewed    Medical Provider Yes    Screening Referrals Made N/A    Medical Referrals Made N/A    Medical Appointment Completed N/A    CNP Interventions Advocate/Support;Navigate Healthcare System;Case Management;Counsel;Educate    Screenings CN Performed Blood Pressure;Blood Glucose    ED Visit Averted N/A    Life-Saving Intervention Made N/A          Patient came in requesting reading glasses. She has a prescription from [facility] but has not picked up transcription eye glasses due to financial difficulties. Provided with reading glasses by request.   Naomie Aly RN BSN PCCN  [facility] [phone]-cell [phone]-office

## 2023-09-02 ENCOUNTER — Ambulatory Visit: Payer: Self-pay

## 2023-09-02 NOTE — Telephone Encounter (Signed)
 FYI Only or Action Required?: FYI only for provider.  Patient was last seen in primary care on 05/27/2023 by de Peru, Quintin PARAS, MD.  Called Nurse Triage reporting Back Pain.  Symptoms began several days ago.  Interventions attempted: Other: cream from pharmacy .  Symptoms are: unchanged.  Triage Disposition: See HCP Within 4 Hours (Or PCP Triage)  Patient/caregiver understands and will follow disposition?: Yes  No appts until 10/07/23, recommended pt go to Ortho clinic at [facility] tomorrow and can call to see if appt needed or not. Pt verbalized understanding.   Pt requesting refill for Vitamin D , Ergocalciferol , (DRISDOL ) 1.25 MG (50000 UNIT) CAPS capsule as well. LRF 05/27/23 #10/0.   Pt also c/o of burning and hurting when urinating and wanting to see provider for GYN referral. Advised no appts available will send message to provider and if unable to be in with PCP can go to UC and be seen.   Copied from CRM [id number]. Topic: Clinical - Red Word Triage >> Sep 02, 2023  4:46 PM Tobias L wrote: Red Word that prompted transfer to Nurse Triage: Severe back pain for the last 3 days going up to her shoulders   Interpreter ID: [id number] Reason for Disposition  [1] SEVERE back pain (e.g., excruciating, unable to do any normal activities) AND [2] not improved 2 hours after pain medicine  Answer Assessment - Initial Assessment Questions 1. ONSET: When did the pain begin? (e.g., minutes, hours, days)     3 days  2. LOCATION: Where does it hurt? (upper, mid or lower back)     Mid to lower back  3. SEVERITY: How bad is the pain?  (e.g., Scale 1-10; mild, moderate, or severe)     10/10 4. PATTERN: Is the pain constant? (e.g., yes, no; constant, intermittent)      Comes and goes  5. RADIATION: Does the pain shoot into your legs or somewhere else?     Up to shoulders and RLE 8. MEDICINES: What have you taken so far for the pain? (e.g., nothing, acetaminophen , NSAIDS)     Cream from pharmacy  and unable to use d/t burning too bad  10. OTHER SYMPTOMS: Do you have any other symptoms? (e.g., fever, abdomen pain, burning with urination, blood in urine)       Difficulty sleeping at nights  Protocols used: Back Pain-A-AH

## 2023-09-03 NOTE — Telephone Encounter (Signed)
 Spoke with patient using interpreter services. Patient rescheduled for 9/3 but is unhappy that her appt was moved out so far. Advised this was first available appointment as she missed her last 2 appointments that were scheduled for July.

## 2023-09-17 ENCOUNTER — Ambulatory Visit (INDEPENDENT_AMBULATORY_CARE_PROVIDER_SITE_OTHER): Admitting: Internal Medicine

## 2023-09-17 ENCOUNTER — Encounter: Payer: Self-pay | Admitting: Internal Medicine

## 2023-09-17 VITALS — BP 116/80 | HR 96 | Ht 63.5 in | Wt 159.0 lb

## 2023-09-17 DIAGNOSIS — R112 Nausea with vomiting, unspecified: Secondary | ICD-10-CM | POA: Diagnosis not present

## 2023-09-17 DIAGNOSIS — K219 Gastro-esophageal reflux disease without esophagitis: Secondary | ICD-10-CM

## 2023-09-17 DIAGNOSIS — K449 Diaphragmatic hernia without obstruction or gangrene: Secondary | ICD-10-CM

## 2023-09-17 DIAGNOSIS — R1013 Epigastric pain: Secondary | ICD-10-CM

## 2023-09-17 DIAGNOSIS — Z1211 Encounter for screening for malignant neoplasm of colon: Secondary | ICD-10-CM

## 2023-09-17 MED ORDER — PANTOPRAZOLE SODIUM 20 MG PO TBEC: 20.0000 mg | DELAYED_RELEASE_TABLET | Freq: Every day | ORAL | 1 refills | Status: AC

## 2023-09-17 NOTE — Progress Notes (Signed)
 Chief Complaint: Epigastric pain, nausea, vomiting, GERD  HPI: Stacy Conner is a 53 year old female who presents for follow up of heartburn, stomach pain, nausea, and vomiting.  Interval History: Video interpreter for Arabic/Sudanese was used to help with interpretation. She did have improvement in her epigastric ab pain after her procedure but then had a recurrence in her pain about 2 days ago. She thinks that she ate something that set off her pain. Pain is starting to get better again. She took her PPI therapy for 2 months and then she stopped it. The medicine helped a lot when she was taking it. Overall she is feeling better than she has in the past and is very appreciative  Past Medical History:  Diagnosis Date   Allergic conjunctivitis and rhinitis 04/02/2022   Chronic low back pain with right-sided sciatica    Chronic otitis externa of right ear 04/02/2022   GERD (gastroesophageal reflux disease)    Intractable migraine with aura with status migrainosus 08/20/2022   Prediabetes 06/23/2022   Vitamin D  deficiency 06/23/2022    Past Surgical History:  Procedure Laterality Date   NO PAST SURGERIES      Current Outpatient Medications  Medication Sig Dispense Refill   acetaminophen  (TYLENOL ) 500 MG tablet Take 1 tablet (500 mg total) by mouth every 6 (six) hours as needed. (Patient taking differently: Take 500 mg by mouth as needed.) 30 tablet 0   cetirizine  (ZYRTEC ) 10 MG chewable tablet Chew 1 tablet (10 mg total) by mouth daily. 30 tablet 0   Cholecalciferol (VITAMIN D -3) 125 MCG (5000 UT) TABS Take 1 tablet by mouth daily.     pantoprazole  (PROTONIX ) 40 MG tablet Take 1 tablet (40 mg total) by mouth 2 (two) times daily. 200 tablet 3   Vitamin D , Ergocalciferol , (DRISDOL ) 1.25 MG (50000 UNIT) CAPS capsule Take 1 capsule (50,000 Units total) by mouth every 7 (seven) days. 10 capsule 0   No current facility-administered medications for this visit.    Allergies as  of 09/17/2023 - Review Complete 09/17/2023  Allergen Reaction Noted   Penicillins Itching and Palpitations 10/14/2021    Family History  Problem Relation Age of Onset   Colon cancer Neg Hx    Esophageal cancer Neg Hx    Rectal cancer Neg Hx    Stomach cancer Neg Hx     Social History   Socioeconomic History   Marital status: Married    Spouse name: Not on file   Number of children: 3   Years of education: Not on file   Highest education level: Not on file  Occupational History   Not on file  Tobacco Use   Smoking status: Never    Passive exposure: Never   Smokeless tobacco: Never  Vaping Use   Vaping status: Never Used  Substance and Sexual Activity   Alcohol use: Never   Drug use: Never   Sexual activity: Yes    Birth control/protection: None  Other Topics Concern   Not on file  Social History Narrative   Not on file   Social Drivers of Health   Financial Resource Strain: Not on file  Food Insecurity: Low Risk  (09/09/2022)   Received from [facility]   Hunger Vital Sign    Within the past 12 months, you worried that your food would run out before you got money to buy more: Never true    Within the past 12 months, the food you bought just didn't  last and you didn't have money to get more. : Never true  Transportation Needs: Not on file (09/09/2022)  Physical Activity: Not on file  Stress: Not on file  Social Connections: Not on file  Intimate Partner Violence: Not on file     Physical Exam:  Vital signs: BP 116/80 (BP Location: Left Arm, Patient Position: Sitting, Cuff Size: Normal)   Pulse 96   Ht 5' 3.5 (1.613 m)   Wt 159 lb (72.1 kg)   LMP 08/27/2023   BMI 27.72 kg/m   Constitutional: NAD, alert and cooperative Head:  Normocephalic and atraumatic. Eyes:   PEERL, EOMI. No icterus. Conjunctiva pink. Respiratory: Respirations even and unlabored. Lungs clear to auscultation bilaterally.   No wheezes, crackles, or rhonchi.  Cardiovascular:  Regular  rate Gastrointestinal:  Soft, nondistended, nontender Msk:  Symmetrical without gross deformities. Without edema, no deformity or joint abnormality.  Neurologic:  Alert and  oriented x4;  grossly normal neurologically.  Skin:   Dry and intact without significant lesions or rashes. Psychiatric: Oriented to person, place and time. Demonstrates good judgement and reason without abnormal affect or behaviors.   RELEVANT LABS AND IMAGING: CBC    Component Value Date/Time   WBC 6.8 05/20/2023 0810   RBC 4.65 05/20/2023 0810   HGB 14.1 05/20/2023 0810   HCT 43.2 05/20/2023 0810   PLT 283 05/20/2023 0810   MCV 93 05/20/2023 0810   MCH 30.3 05/20/2023 0810   MCHC 32.6 05/20/2023 0810   RDW 12.3 05/20/2023 0810   LYMPHSABS 2.7 05/20/2023 0810   EOSABS 0.5 (H) 05/20/2023 0810   BASOSABS 0.0 05/20/2023 0810    CMP     Component Value Date/Time   NA 140 05/20/2023 0810   K 3.9 05/20/2023 0810   CL 102 05/20/2023 0810   CO2 24 05/20/2023 0810   GLUCOSE 106 (H) 05/20/2023 0810   BUN 16 05/20/2023 0810   CREATININE 0.54 (L) 05/20/2023 0810   CALCIUM 9.3 05/20/2023 0810   PROT 7.6 05/20/2023 0810   ALBUMIN 4.4 05/20/2023 0810   AST 24 05/20/2023 0810   ALT 19 05/20/2023 0810   ALKPHOS 76 05/20/2023 0810   BILITOT 0.3 05/20/2023 0810   EGD 07/07/23: - Normal esophagus. - Small hiatal hernia. - Erythematous mucosa in the antrum. Biopsied. - Normal examined duodenum. Biopsied. Path: 1. Surgical [P], duodenal :       - BENIGN SMALL BOWEL MUCOSA WITH NO SIGNIFICANT PATHOLOGIC CHANGES       2. Surgical [P], gastric :       - MILD CHRONIC GASTRITIS.       - NEGATIVE FOR H. PYLORI ON H&E STAIN       - NO INTESTINAL METAPLASIA, DYSPLASIA, OR MALIGNANCY.   Assessment/Plan:   Epigastric pain - improved Nausea and vomiting - improved GERD - improved Patient had a dramatic improvement in her symptoms after taking PPI BID for 8 weeks after her procedure. She now just had a recent recurrence  due to the types of foods that she is eating. Will maintain on a low dose PPI to try to prevent recurrence of her symptoms - Restart pantoprazole  20 mg daily to prevent epigastric ab pain recurrence - RTC in 1 year  Colon cancer screening No previous colonoscopy.  No family history of colon cancer. She is not interested in a colonoscopy at this time because fasting is difficult for her - Cologuard test  Estefana Kidney, MD [facility] 09/17/2023, 10:14 AM  I spent  31 minutes of time, including in depth chart review, independent review of results as outlined above, communicating results with the patient directly, face-to-face time with the patient, coordinating care, and ordering studies and medications as appropriate, and documentation.

## 2023-09-17 NOTE — Patient Instructions (Addendum)
 Your provider has ordered Cologuard testing as an option for colon cancer screening. This is performed by [employer] and may be out of network with your insurance. PRIOR to completing the test, it is YOUR responsibility to contact your insurance about covered benefits for this test. Your out of pocket expense could be anywhere from $0.00 to $649.00.   When you call to check coverage with your insurer, please provide the following information:   -The ONLY provider of Cologuard is Optician, dispensing  - CPT code for Cologuard is [phone].  [employer] NPI # [id number]  -[employer] Tax ID # [id number]   We have already sent your demographic and insurance information to [employer] (phone number [phone]) and they should contact you within the next week regarding your test. If you have not heard from them within the next week, please call our office at [phone].   We have sent the following medications to your pharmacy for you to pick up at your convenience: Pantoprazole   Follow up in 1 year  _______________________________________________________  If your blood pressure at your visit was 140/90 or greater, please contact your primary care physician to follow up on this.  _______________________________________________________  If you are age 51 or older, your body mass index should be between 23-30. Your Body mass index is 27.72 kg/m. If this is out of the aforementioned range listed, please consider follow up with your Primary Care Provider.  If you are age 51 or younger, your body mass index should be between 19-25. Your Body mass index is 27.72 kg/m. If this is out of the aformentioned range listed, please consider follow up with your Primary Care Provider.   ________________________________________________________  The [facility] providers would like to encourage you to use MYCHART to communicate with providers for non-urgent  requests or questions.  Due to long hold times on the telephone, sending your provider a message by [facility] may be a faster and more efficient way to get a response.  Please allow 48 business hours for a response.  Please remember that this is for non-urgent requests.  _______________________________________________________  [facility] is using a team-based approach to care.  Your team is made up of your doctor and two to three APPS. Our APPS (Nurse Practitioners and Physician Assistants) work with your physician to ensure care continuity for you. They are fully qualified to address your health concerns and develop a treatment plan. They communicate directly with your gastroenterologist to care for you. Seeing the Advanced Practice Practitioners on your physician's team can help you by facilitating care more promptly, often allowing for earlier appointments, access to diagnostic testing, procedures, and other specialty referrals.   Thank you for entrusting me with your care and for choosing [facility],  Dr. Estefana Kidney

## 2023-10-21 ENCOUNTER — Encounter (HOSPITAL_BASED_OUTPATIENT_CLINIC_OR_DEPARTMENT_OTHER): Payer: Self-pay | Admitting: Family Medicine

## 2023-10-21 ENCOUNTER — Other Ambulatory Visit (HOSPITAL_BASED_OUTPATIENT_CLINIC_OR_DEPARTMENT_OTHER): Payer: Self-pay | Admitting: *Deleted

## 2023-10-21 ENCOUNTER — Ambulatory Visit (HOSPITAL_BASED_OUTPATIENT_CLINIC_OR_DEPARTMENT_OTHER): Admitting: Family Medicine

## 2023-10-21 VITALS — BP 134/87 | HR 109 | Ht 64.0 in | Wt 155.7 lb

## 2023-10-21 DIAGNOSIS — M545 Low back pain, unspecified: Secondary | ICD-10-CM | POA: Diagnosis not present

## 2023-10-21 DIAGNOSIS — E785 Hyperlipidemia, unspecified: Secondary | ICD-10-CM | POA: Diagnosis not present

## 2023-10-21 DIAGNOSIS — E559 Vitamin D deficiency, unspecified: Secondary | ICD-10-CM | POA: Diagnosis not present

## 2023-10-21 DIAGNOSIS — H539 Unspecified visual disturbance: Secondary | ICD-10-CM | POA: Insufficient documentation

## 2023-10-21 DIAGNOSIS — Z7689 Persons encountering health services in other specified circumstances: Secondary | ICD-10-CM

## 2023-10-21 DIAGNOSIS — Z124 Encounter for screening for malignant neoplasm of cervix: Secondary | ICD-10-CM

## 2023-10-21 MED ORDER — CETIRIZINE HCL 10 MG PO CHEW: 10.0000 mg | CHEWABLE_TABLET | Freq: Every day | ORAL | 0 refills | Status: AC

## 2023-10-21 NOTE — Assessment & Plan Note (Addendum)
 Chronic issue for patient.  Has been following with orthopedic surgeon regarding this, recommend continued follow-up with Ortho - she has not followed up with ortho or had MRI completed yet. Provided contact information for specialist office as well as imaging center for MRI

## 2023-10-21 NOTE — Assessment & Plan Note (Signed)
 Patient reports some vision changes as well as some eye pain noted.  Reports that she has had prior evaluation with an eye doctor, has some documentation from provider through [facility].  She was given prescription for glasses, however reports that she never did obtain glasses.  She also reports receiving eyedrops and feels that these did not provide significant benefit. We discussed considerations today and she would like to have referral to alternative eye specialist locally, I feel this is reasonable, referral placed today.

## 2023-10-21 NOTE — Patient Instructions (Addendum)
  Medication Instructions:  Your physician recommends that you continue on your current medications as directed. Please refer to the Current Medication list given to you today. --If you need a refill on any your medications before your next appointment, please call your pharmacy first. If no refills are authorized on file call the office.-- Lab Work: Your physician has recommended that you have lab work today: today If you have labs (blood work) drawn today and your tests are completely normal, you will receive your results via MyChart message OR a phone call from our staff.  Please ensure you check your voicemail in the event that you authorized detailed messages to be left on a delegated number. If you have any lab test that is abnormal or we need to change your treatment, we will call you to review the results.  Referrals/Procedures/Imaging: Maralee Fetter) [phone] [facility] [phone]  Follow-Up: Your next appointment:   Your physician recommends that you schedule a follow-up appointment in: 3-4 months follow up  with Dr. de Peru  You will receive a text message or e-mail with a link to a survey about your care and experience with us  today! We would greatly appreciate your feedback!   Thanks for letting us  be apart of your health journey!!  Primary Care and Sports Medicine   Dr. Quintin sheerer Peru   We encourage you to activate your patient portal called MyChart.  Sign up information is provided on this After Visit Summary.  MyChart is used to connect with patients for Virtual Visits (Telemedicine).  Patients are able to view lab/test results, encounter notes, upcoming appointments, etc.  Non-urgent messages can be sent to your provider as well. To learn more about what you can do with MyChart, please visit --  [email or web address].

## 2023-10-21 NOTE — Assessment & Plan Note (Signed)
 Vit D low on prior labs. We can recheck level today. Not currently taking any vitamin D  supplement.

## 2023-10-21 NOTE — Progress Notes (Signed)
    Procedures performed today:    None.  Independent interpretation of notes and tests performed by another provider:   None.  Brief History, Exam, Impression, and Recommendations:    BP 134/87 (BP Location: Left Arm, Patient Position: Sitting, Cuff Size: Normal)   Pulse (!) 109   Ht 5' 4 (1.626 m)   Wt 155 lb 11.2 oz (70.6 kg)   SpO2 100%   BMI 26.73 kg/m   In-person interpreter utilized for duration of visit.  Vitamin D  deficiency Assessment & Plan: Vit D low on prior labs. We can recheck level today. Not currently taking any vitamin D  supplement.  Orders: -     VITAMIN D  25 Hydroxy (Vit-D Deficiency, Fractures)  Vision changes Assessment & Plan: Patient reports some vision changes as well as some eye pain noted.  Reports that she has had prior evaluation with an eye doctor, has some documentation from provider through [facility].  She was given prescription for glasses, however reports that she never did obtain glasses.  She also reports receiving eyedrops and feels that these did not provide significant benefit. We discussed considerations today and she would like to have referral to alternative eye specialist locally, I feel this is reasonable, referral placed today.  Orders: -     Ambulatory referral to Ophthalmology  Right low back pain, unspecified chronicity, unspecified whether sciatica present Assessment & Plan: Chronic issue for patient.  Has been following with orthopedic surgeon regarding this, recommend continued follow-up with Ortho - she has not followed up with ortho or had MRI completed yet. Provided contact information for specialist office as well as imaging center for MRI   Hyperlipidemia, unspecified hyperlipidemia type Assessment & Plan: Reviewed prior labs.  Her calculated ASCVD risk score is 2.3%.  Given this, reviewed primary recommendation of focusing on lifestyle modifications.  Discussed that medication would not be warranted at this time.  We  did review exercise and dietary recommendations.   Encounter to establish care -     Ambulatory referral to Obstetrics / Gynecology  Cervical cancer screening -     Ambulatory referral to Obstetrics / Gynecology  Return in about 4 months (around 02/20/2024).  Spent 32 minutes on this patient encounter, including preparation, chart review, face-to-face counseling with patient and coordination of care, and documentation of encounter   ___________________________________________ Isiaha Greenup de Peru, MD, ABFM, [facility]

## 2023-10-21 NOTE — Assessment & Plan Note (Signed)
 Reviewed prior labs.  Her calculated ASCVD risk score is 2.3%.  Given this, reviewed primary recommendation of focusing on lifestyle modifications.  Discussed that medication would not be warranted at this time.  We did review exercise and dietary recommendations.

## 2023-10-22 LAB — VITAMIN D 25 HYDROXY (VIT D DEFICIENCY, FRACTURES): Vit D, 25-Hydroxy: 32.6 ng/mL (ref 30.0–100.0)

## 2023-11-05 ENCOUNTER — Ambulatory Visit (HOSPITAL_BASED_OUTPATIENT_CLINIC_OR_DEPARTMENT_OTHER): Payer: Self-pay | Admitting: Family Medicine

## 2023-11-05 MED ORDER — VITAMIN D-3 125 MCG (5000 UT) PO TABS: 1.0000 | ORAL_TABLET | Freq: Every day | ORAL | 1 refills | Status: AC

## 2023-11-17 ENCOUNTER — Encounter: Payer: Self-pay | Admitting: Physician Assistant

## 2023-11-18 ENCOUNTER — Encounter: Payer: Self-pay | Admitting: Physician Assistant

## 2023-11-22 ENCOUNTER — Other Ambulatory Visit

## 2023-11-26 ENCOUNTER — Other Ambulatory Visit

## 2023-12-03 ENCOUNTER — Ambulatory Visit: Admitting: Physician Assistant

## 2023-12-14 ENCOUNTER — Ambulatory Visit: Admitting: Orthopedic Surgery

## 2023-12-21 ENCOUNTER — Encounter: Payer: Self-pay | Admitting: Radiology

## 2023-12-22 ENCOUNTER — Ambulatory Visit (HOSPITAL_BASED_OUTPATIENT_CLINIC_OR_DEPARTMENT_OTHER): Admitting: Family Medicine

## 2023-12-22 ENCOUNTER — Telehealth (HOSPITAL_BASED_OUTPATIENT_CLINIC_OR_DEPARTMENT_OTHER): Payer: Self-pay | Admitting: *Deleted

## 2023-12-22 NOTE — Telephone Encounter (Addendum)
 Contacted pt using [employer] [phone] to inform her that the appt today was scheduled too early  it was supposed to be scheduled around 02/20/24, I rescheduled her appt for 12/31 at 8:30.  While on phone she asked about her medication vit. D and zyrtec .  Contacted pharmacy and they said it is not covered by insurance.  She wanted to know why informed her that most insurance will not cover OTC medications.

## 2023-12-29 NOTE — Congregational Nurse Program (Addendum)
  Dept: [phone]   Congregational Nurse Program Note  Date of Encounter: 12/29/2023  Past Medical History: Past Medical History:  Diagnosis Date   Allergic conjunctivitis and rhinitis 04/02/2022   Chronic low back pain with right-sided sciatica    Chronic otitis externa of right ear 04/02/2022   GERD (gastroesophageal reflux disease)    Intractable migraine with aura with status migrainosus 08/20/2022   Prediabetes 06/23/2022   Vitamin D  deficiency 06/23/2022    Encounter Details:  Community Questionnaire - 12/29/23 1101       Questionnaire   Ask client: Do you give verbal consent for me to treat you today? Yes    Student Assistance Medical Student    Location Patient Served  [facility]    Encounter Setting CN site    Population Status Migrant/Refugee    Insurance Medicaid    Insurance/Financial Assistance Referral N/A    Medication Have Medication Insecurities    Medical Provider Yes    Screening Referrals Made N/A    Medical Referrals Made Urgent Care    Medical Appointment Completed [facility]    CNP Interventions Advocate/Support;Navigate Healthcare System;Case Management;Counsel;Educate    Screenings CN Performed Blood Pressure    ED Visit Averted N/A    Life-Saving Intervention Made N/A         Patient came in reporting loss of voice for 3 days. Reports that her medicaid will expire in a month and would like to be seen today before it expires. Referred to urgent care. Transportation assistance provided.   Naomie Thereasa Iannello RN BSN PCCN  [facility] [phone]-cell [phone]-office

## 2024-02-05 ENCOUNTER — Other Ambulatory Visit (HOSPITAL_COMMUNITY)
Admission: RE | Admit: 2024-02-05 | Discharge: 2024-02-05 | Disposition: A | Discharge status: Home / Self Care | Source: Ambulatory Visit | Attending: Obstetrics and Gynecology | Admitting: Obstetrics and Gynecology

## 2024-02-05 ENCOUNTER — Encounter: Payer: Self-pay | Admitting: Obstetrics and Gynecology

## 2024-02-05 ENCOUNTER — Ambulatory Visit: Admitting: Obstetrics and Gynecology

## 2024-02-05 VITALS — BP 128/87 | HR 85 | Wt 153.6 lb

## 2024-02-05 DIAGNOSIS — Z113 Encounter for screening for infections with a predominantly sexual mode of transmission: Secondary | ICD-10-CM | POA: Insufficient documentation

## 2024-02-05 DIAGNOSIS — Z3202 Encounter for pregnancy test, result negative: Secondary | ICD-10-CM

## 2024-02-05 DIAGNOSIS — Z01419 Encounter for gynecological examination (general) (routine) without abnormal findings: Secondary | ICD-10-CM | POA: Diagnosis not present

## 2024-02-05 DIAGNOSIS — Z0000 Encounter for general adult medical examination without abnormal findings: Secondary | ICD-10-CM | POA: Diagnosis present

## 2024-02-05 DIAGNOSIS — Z3169 Encounter for other general counseling and advice on procreation: Secondary | ICD-10-CM

## 2024-02-05 DIAGNOSIS — Z3200 Encounter for pregnancy test, result unknown: Secondary | ICD-10-CM

## 2024-02-05 LAB — POCT URINE PREGNANCY: Preg Test, Ur: NEGATIVE

## 2024-02-05 NOTE — Progress Notes (Signed)
 Pt states she would like to get pregnant.   Pt reports birthday listed is not her actual birthday. That her birth certificate was lost due to war in [place]. She was given a new one with this birth date. She states she is actually 53 years old.   Pt reports she had a mammogram 06/05/2022

## 2024-02-05 NOTE — Progress Notes (Signed)
 "  ANNUAL EXAM Patient name: Stacy Conner MRN [id number]  Date of birth: 08/09/1970 Chief Complaint:   No chief complaint on file.  History of Present Illness:   Stacy Conner is a 53 y.o. G3P3003 being seen today for a routine annual exam.  Current complaints: annual Reported to RN that age is incorrect and is 53 years old  Menstrual concerns? No   Breast or nipple changes? No  Contraception use? No  Sexually active? Yes ttc; used contraception 3 years ago, stopped it to try to conceive Same partner/husband as prior pregnancies, stop breastfeeding and then used contraceptive for 3 years, has been off of it for 3-4 years    FINAL MICROSCOPIC DIAGNOSIS:   A. ENDOMETRIUM, BIOPSY:  - Benign endometrium with breakdown  - Negative for hyperplasia or malignancy   B. ENDOCERVIX, CURETTAGE:  - Benign lower uterine segment mucosa  - Scant benign cervical glandular mucosa  - Negative for dysplasia or malignancy   Patient's last menstrual period was 01/25/2024 (approximate).   The pregnancy intention screening data noted above was reviewed. Potential methods of contraception were discussed. The patient elected to proceed with No data recorded.   Last pap     Component Value Date/Time   DIAGPAP - Atypical glandular cells, NOS (A) 06/10/2021 0935   DIAGPAP - See comment (A) 06/10/2021 0935   HPVHIGH Negative 06/10/2021 0935   ADEQPAP  06/10/2021 0935    Satisfactory for evaluation; transformation zone component PRESENT.     H/O abnormal pap: yes Last mammogram: 05/2022 [facility] 1.  Last colonoscopy: N/A.      02/05/2024   11:10 AM 10/21/2023   10:21 AM 05/27/2023    8:27 AM 02/26/2023    8:51 AM 08/20/2022   10:05 AM  Depression screen PHQ 2/9  Decreased Interest 1 0 0 0 0  Down, Depressed, Hopeless 0 0 0 0 0  PHQ - 2 Score 1 0 0 0 0  Altered sleeping 0 0 1 0   Tired, decreased energy 0 0 3 0   Change in appetite 0 0 0 0   Feeling bad or failure about  yourself  0 0 0 0   Trouble concentrating 0 0 0 0   Moving slowly or fidgety/restless 0 0 0 0   Suicidal thoughts 0 0 0 0   PHQ-9 Score 1 0  4  0    Difficult doing work/chores  Not difficult at all Somewhat difficult Not difficult at all      Data saved with a previous flowsheet row definition        02/05/2024   11:11 AM 10/21/2023   10:21 AM 05/27/2023    8:29 AM 02/26/2023    8:52 AM  GAD 7 : Generalized Anxiety Score  Nervous, Anxious, on Edge 0 0 0 0  Control/stop worrying 0 0 0 0  Worry too much - different things 0 0 0 0  Trouble relaxing 0 0 0 0  Restless 0 0 0 0  Easily annoyed or irritable 0 0 0 0  Afraid - awful might happen 0 0 0 0  Total GAD 7 Score 0 0 0 0  Anxiety Difficulty  Not difficult at all Not difficult at all Not difficult at all     Review of Systems:   Pertinent items are noted in HPI Denies any headaches, blurred vision, fatigue, shortness of breath, chest pain, abdominal pain, abnormal vaginal discharge/itching/odor/irritation, problems with periods, bowel movements, urination,  or intercourse unless otherwise stated above. Pertinent History Reviewed:  Reviewed past medical,surgical, social and family history.  Reviewed problem list, medications and allergies. Physical Assessment:   Vitals:   02/05/24 1111  BP: 128/87  Pulse: 85  Weight: 153 lb 9.6 oz (69.7 kg)  Body mass index is 26.37 kg/m.        Physical Examination:   General appearance - well appearing, and in no distress  Mental status - alert, oriented to person, place, and time  Psych:  She has a normal mood and affect  Skin - warm and dry, normal color, no suspicious lesions noted  Chest - effort normal, all lung fields clear to auscultation bilaterally  Heart - normal rate and regular rhythm  Breasts - breasts appear normal, no suspicious masses, no skin or nipple changes or  axillary nodes  Abdomen - soft, nontender, nondistended, no masses or organomegaly  Pelvic -  VULVA:  normal appearing vulva with no masses, tenderness or lesions   VAGINA: normal appearing vagina with normal color and discharge, no lesions   CERVIX: normal appearing cervix without discharge or lesions, no CMT  Thin prep pap is done with HR HPV cotesting  UTERUS: uterus is felt to be normal size, shape, consistency and nontender   ADNEXA: No adnexal masses or tenderness noted.  Extremities:  No swelling or varicosities noted  Chaperone present for exam  Results for orders placed or performed in visit on 02/05/24 (from the past 24 hours)  POCT urine pregnancy   Collection Time: 02/05/24 11:30 AM  Result Value Ref Range   Preg Test, Ur Negative Negative      Assessment & Plan:  1. Annual physical exam (Primary) - Cervical cancer screening: Discussed guidelines. Pap with HPV collected today - STD Testing: accepts - Birth Control: Discussed options and their risks, benefits and common side effects; discussed VTE with estrogen containing options. Desires: none - Breast Health: Encouraged self breast awareness/SBE. Teaching provided. Discussed limits of clinical breast exam for detecting breast cancer. Rx given for MXR - F/U 12 months and prn  - Cytology - PAP( [place]) - MM 3D SCREENING MAMMOGRAM BILATERAL BREAST; Future  2. Possible pregnancy AMH ordered today will follow up to further discuss work up of infertility. Unclear how much age contributes as patient reports being 10 years younger than what's documented.  - POCT urine pregnancy  3. Screen for STD (sexually transmitted disease) - Cervicovaginal ancillary only( [place]) - Hepatitis B Surface AntiGEN - Hepatitis C Antibody - HIV antibody (with reflex) - RPR       Orders Placed This Encounter  Procedures   MM 3D SCREENING MAMMOGRAM BILATERAL BREAST   Hepatitis B Surface AntiGEN   Hepatitis C Antibody   HIV antibody (with reflex)   RPR   POCT urine pregnancy    Meds: No orders of the defined types were  placed in this encounter.   Follow-up: No follow-ups on file.  Carter Quarry, MD 02/05/2024 11:45 AM  "

## 2024-02-06 LAB — HIV ANTIBODY (ROUTINE TESTING W REFLEX): HIV Screen 4th Generation wRfx: NONREACTIVE

## 2024-02-06 LAB — HEPATITIS B SURFACE ANTIGEN: Hepatitis B Surface Ag: NEGATIVE

## 2024-02-06 LAB — SYPHILIS: RPR W/REFLEX TO RPR TITER AND TREPONEMAL ANTIBODIES, TRADITIONAL SCREENING AND DIAGNOSIS ALGORITHM: RPR Ser Ql: NONREACTIVE

## 2024-02-06 LAB — HEPATITIS C ANTIBODY: Hep C Virus Ab: NONREACTIVE

## 2024-02-08 ENCOUNTER — Ambulatory Visit: Payer: Self-pay | Admitting: Obstetrics and Gynecology

## 2024-02-08 LAB — CERVICOVAGINAL ANCILLARY ONLY
Bacterial Vaginitis (gardnerella): NEGATIVE
Candida Glabrata: NEGATIVE
Candida Vaginitis: NEGATIVE
Chlamydia: NEGATIVE
Comment: NEGATIVE
Comment: NEGATIVE
Comment: NEGATIVE
Comment: NEGATIVE
Comment: NEGATIVE
Comment: NORMAL
Neisseria Gonorrhea: NEGATIVE
Trichomonas: NEGATIVE

## 2024-02-09 LAB — CYTOLOGY - PAP
Adequacy: ABSENT
Comment: NEGATIVE
Diagnosis: NEGATIVE
High risk HPV: NEGATIVE

## 2024-02-10 LAB — ANTI MULLERIAN HORMONE: ANTI-MULLERIAN HORMONE (AMH): 0.019 ng/mL

## 2024-02-17 ENCOUNTER — Ambulatory Visit (HOSPITAL_BASED_OUTPATIENT_CLINIC_OR_DEPARTMENT_OTHER): Admitting: Family Medicine

## 2024-03-03 ENCOUNTER — Ambulatory Visit

## 2024-03-24 ENCOUNTER — Ambulatory Visit (HOSPITAL_BASED_OUTPATIENT_CLINIC_OR_DEPARTMENT_OTHER): Admitting: Family Medicine

## 2024-04-07 ENCOUNTER — Ambulatory Visit

## 2024-05-06 ENCOUNTER — Ambulatory Visit (HOSPITAL_BASED_OUTPATIENT_CLINIC_OR_DEPARTMENT_OTHER): Admitting: Family Medicine
